# Patient Record
Sex: Male | Born: 1970 | Race: White | Hispanic: No | Marital: Married | Smoking: Never smoker
Health system: Southern US, Community
[De-identification: ages and names within clinical notes are randomized; demographics above are authoritative.]

## PROBLEM LIST (undated history)

## (undated) DIAGNOSIS — Z8249 Family history of ischemic heart disease and other diseases of the circulatory system: Secondary | ICD-10-CM

## (undated) DIAGNOSIS — B001 Herpesviral vesicular dermatitis: Secondary | ICD-10-CM

## (undated) DIAGNOSIS — E785 Hyperlipidemia, unspecified: Secondary | ICD-10-CM

## (undated) DIAGNOSIS — C4491 Basal cell carcinoma of skin, unspecified: Secondary | ICD-10-CM

## (undated) DIAGNOSIS — E0500 Thyrotoxicosis with diffuse goiter without thyrotoxic crisis or storm: Secondary | ICD-10-CM

## (undated) HISTORY — DX: Basal cell carcinoma of skin, unspecified: C44.91

## (undated) HISTORY — DX: Herpesviral vesicular dermatitis: B00.1

## (undated) HISTORY — DX: Thyrotoxicosis with diffuse goiter without thyrotoxic crisis or storm: E05.00

## (undated) HISTORY — DX: Family history of ischemic heart disease and other diseases of the circulatory system: Z82.49

## (undated) HISTORY — DX: Hyperlipidemia, unspecified: E78.5

---

## 1998-08-23 ENCOUNTER — Emergency Department (HOSPITAL_COMMUNITY): Admission: EM | Admit: 1998-08-23 | Discharge: 1998-08-23 | Payer: Self-pay | Admitting: Emergency Medicine

## 2004-12-23 ENCOUNTER — Encounter: Admission: RE | Admit: 2004-12-23 | Discharge: 2004-12-23 | Payer: Self-pay | Admitting: Gastroenterology

## 2005-02-03 ENCOUNTER — Ambulatory Visit: Payer: Self-pay | Admitting: Hematology & Oncology

## 2005-03-27 ENCOUNTER — Ambulatory Visit: Payer: Self-pay | Admitting: Hematology & Oncology

## 2006-09-14 ENCOUNTER — Ambulatory Visit: Payer: Self-pay | Admitting: Family Medicine

## 2006-12-08 ENCOUNTER — Ambulatory Visit: Payer: Self-pay | Admitting: Family Medicine

## 2007-01-10 ENCOUNTER — Ambulatory Visit: Payer: Self-pay | Admitting: Family Medicine

## 2008-07-02 ENCOUNTER — Ambulatory Visit: Payer: Self-pay | Admitting: Family Medicine

## 2009-08-27 ENCOUNTER — Ambulatory Visit: Payer: Self-pay | Admitting: Family Medicine

## 2010-06-10 ENCOUNTER — Ambulatory Visit: Payer: Self-pay | Admitting: Family Medicine

## 2010-12-17 HISTORY — PX: VASECTOMY: SHX75

## 2011-07-16 ENCOUNTER — Other Ambulatory Visit: Payer: Self-pay | Admitting: Family Medicine

## 2011-11-30 ENCOUNTER — Other Ambulatory Visit: Payer: Self-pay | Admitting: Family Medicine

## 2011-11-30 NOTE — Telephone Encounter (Signed)
Is this okay?

## 2012-01-07 ENCOUNTER — Encounter: Payer: Self-pay | Admitting: Internal Medicine

## 2012-01-13 ENCOUNTER — Encounter: Payer: Self-pay | Admitting: Family Medicine

## 2012-01-13 ENCOUNTER — Ambulatory Visit (INDEPENDENT_AMBULATORY_CARE_PROVIDER_SITE_OTHER): Payer: 59 | Admitting: Family Medicine

## 2012-01-13 VITALS — BP 120/80 | HR 64 | Ht 68.0 in | Wt 180.0 lb

## 2012-01-13 DIAGNOSIS — M67919 Unspecified disorder of synovium and tendon, unspecified shoulder: Secondary | ICD-10-CM

## 2012-01-13 DIAGNOSIS — Z0000 Encounter for general adult medical examination without abnormal findings: Secondary | ICD-10-CM

## 2012-01-13 DIAGNOSIS — C44310 Basal cell carcinoma of skin of unspecified parts of face: Secondary | ICD-10-CM

## 2012-01-13 DIAGNOSIS — J301 Allergic rhinitis due to pollen: Secondary | ICD-10-CM | POA: Insufficient documentation

## 2012-01-13 DIAGNOSIS — Z8639 Personal history of other endocrine, nutritional and metabolic disease: Secondary | ICD-10-CM

## 2012-01-13 DIAGNOSIS — C44319 Basal cell carcinoma of skin of other parts of face: Secondary | ICD-10-CM

## 2012-01-13 DIAGNOSIS — Z8249 Family history of ischemic heart disease and other diseases of the circulatory system: Secondary | ICD-10-CM | POA: Insufficient documentation

## 2012-01-13 DIAGNOSIS — M7582 Other shoulder lesions, left shoulder: Secondary | ICD-10-CM

## 2012-01-13 DIAGNOSIS — Z23 Encounter for immunization: Secondary | ICD-10-CM

## 2012-01-13 DIAGNOSIS — Z862 Personal history of diseases of the blood and blood-forming organs and certain disorders involving the immune mechanism: Secondary | ICD-10-CM

## 2012-01-13 DIAGNOSIS — Z8619 Personal history of other infectious and parasitic diseases: Secondary | ICD-10-CM

## 2012-01-13 DIAGNOSIS — J45990 Exercise induced bronchospasm: Secondary | ICD-10-CM | POA: Insufficient documentation

## 2012-01-13 LAB — COMPREHENSIVE METABOLIC PANEL
ALT: 109 U/L — ABNORMAL HIGH (ref 0–53)
AST: 273 U/L — ABNORMAL HIGH (ref 0–37)
Albumin: 4.5 g/dL (ref 3.5–5.2)
Alkaline Phosphatase: 79 U/L (ref 39–117)
BUN: 17 mg/dL (ref 6–23)
Creat: 0.97 mg/dL (ref 0.50–1.35)
Potassium: 4.1 mEq/L (ref 3.5–5.3)

## 2012-01-13 LAB — LIPID PANEL
HDL: 39 mg/dL — ABNORMAL LOW (ref >39)
LDL Cholesterol: 131 mg/dL — ABNORMAL HIGH (ref 0–99)
Total CHOL/HDL Ratio: 5 Ratio

## 2012-01-13 NOTE — Progress Notes (Signed)
Subjective:    Patient ID: Shawn Cook, male    DOB: 04/01/1971, 41 y.o.   MRN: [id number]  HPI He is here for complete examination. He recently saw his endocrinologist for evaluation of his underlying Graves' disease and apparently his blood work was normal. He continues to use Valtrex periodically for his herpes labialis. He also has underlying exercise-induced asthma but this is well controlled. He has a lesion present on his nose or the like me to evaluate. He also recently had a vasectomy. He does have 3 children. His marriage is going quite well. He does complain of a one year history of difficulty with left shoulder pain especially with maneuvers with abduction and external rotation. He has no history of injury to this. His been no popping locking or grinding.   Review of Systems Negative except as above    Objective:   Physical Exam BP 120/80  Pulse 64  Ht 5\' 8"  (1.727 m)  Wt 180 lb (81.647 kg)  BMI 27.37 kg/m2  General Appearance:    Alert, cooperative, no distress, appears stated age  Head:    Normocephalic, without obvious abnormality, atraumatic  Eyes:    PERRL, conjunctiva/corneas clear, EOM's intact, fundi    benign  Ears:    Normal TM's and external ear canals  Nose:   Nares normal, mucosa normal, no drainage or sinus   tenderness  Throat:   Lips, mucosa, and tongue normal; teeth and gums normal  Neck:   Supple, no lymphadenopathy;  thyroid:  no   enlargement/tenderness/nodules; no carotid   bruit or JVD  Back:    Spine nontender, no curvature, ROM normal, no CVA     tenderness  Lungs:     Clear to auscultation bilaterally without wheezes, rales or     ronchi; respirations unlabored  Chest Wall:    No tenderness or deformity   Heart:    Regular rate and rhythm, S1 and S2 normal, no murmur, rub   or gallop  Breast Exam:    No chest wall tenderness, masses or gynecomastia  Abdomen:     Soft, non-tender, nondistended, normoactive bowel sounds,    no masses, no  hepatosplenomegaly  Genitalia:   deferred   Rectal:    Normal sphincter tone, no masses or tenderness; guaiac negative stool.  Prostate smooth, no nodules, not enlarged.  Extremities:   No clubbing, cyanosis or edema.left shoulder exam shows good motion. Drop arm test was uncomfortable. No laxity noted. No palpable tenderness.   Pulses:   2+ and symmetric all extremities  Skin:   Skin color, texture, turgor normal, no rashes or lesions  Lymph nodes:   Cervical, supraclavicular, and axillary nodes normal  Neurologic:   CNII-XII intact, normal strength, sensation and gait; reflexes 2+ and symmetric throughout          Psych:   Normal mood, affect, hygiene and grooming.   EKG shows no acute changes.        Assessment & Plan:   1. Routine general medical examination at a health care facility  PR ELECTROCARDIOGRAM, COMPLETE, Tdap vaccine greater than or equal to 7yo IM, CBC with Differential, Comprehensive metabolic panel, Lipid panel, Hemoccult - 1 Card (office)  2. History of Graves' disease    3. Exercise-induced asthma    4. Allergic rhinitis due to pollen    5. History of herpes labialis    6. Family history of heart disease in male family member before age 48  PR ELECTROCARDIOGRAM,  COMPLETE  7. BCE (basal cell epithelioma), face  Ambulatory referral to Dermatology  8. Tendinitis of left rotator cuff     the left shoulder was prepped posteriorly with Betadine. 40 mg of Kenalog and 3 cc of Xylocaine was injected in the subacromial bursa without difficulty. He did get relief of his symptoms. I will renew his Ventolin as well as the Valtrex. TDaP given.

## 2012-01-13 NOTE — Patient Instructions (Signed)
Let me know if the shoulder continues to give you trouble.

## 2012-01-14 LAB — CBC WITH DIFFERENTIAL/PLATELET
Basophils Absolute: 0 10*3/uL (ref 0.0–0.1)
Basophils Relative: 0 % (ref 0–1)
HCT: 47.6 % (ref 39.0–52.0)
Lymphocytes Relative: 33 % (ref 12–46)
MCHC: 34.2 g/dL (ref 30.0–36.0)
Monocytes Absolute: 0.4 10*3/uL (ref 0.1–1.0)
Neutro Abs: 2.6 10*3/uL (ref 1.7–7.7)
Neutrophils Relative %: 55 % (ref 43–77)
Platelets: 242 10*3/uL (ref 150–400)
RDW: 12.2 % (ref 11.5–15.5)
WBC: 4.8 10*3/uL (ref 4.0–10.5)

## 2012-03-04 ENCOUNTER — Encounter: Payer: Self-pay | Admitting: Family Medicine

## 2012-03-04 ENCOUNTER — Ambulatory Visit (INDEPENDENT_AMBULATORY_CARE_PROVIDER_SITE_OTHER): Payer: 59 | Admitting: Family Medicine

## 2012-03-04 ENCOUNTER — Ambulatory Visit
Admission: RE | Admit: 2012-03-04 | Discharge: 2012-03-04 | Disposition: A | Discharge status: Home / Self Care | Payer: 59 | Source: Ambulatory Visit | Attending: Family Medicine | Admitting: Family Medicine

## 2012-03-04 VITALS — BP 140/90 | HR 76 | Wt 180.0 lb

## 2012-03-04 DIAGNOSIS — M25512 Pain in left shoulder: Secondary | ICD-10-CM

## 2012-03-04 DIAGNOSIS — R748 Abnormal levels of other serum enzymes: Secondary | ICD-10-CM

## 2012-03-04 DIAGNOSIS — M25519 Pain in unspecified shoulder: Secondary | ICD-10-CM

## 2012-03-04 DIAGNOSIS — R7401 Elevation of levels of liver transaminase levels: Secondary | ICD-10-CM

## 2012-03-04 LAB — COMPREHENSIVE METABOLIC PANEL
ALT: 39 U/L (ref 0–53)
Alkaline Phosphatase: 63 U/L (ref 39–117)
Potassium: 4.1 mEq/L (ref 3.5–5.3)
Sodium: 140 mEq/L (ref 135–145)
Total Bilirubin: 0.5 mg/dL (ref 0.3–1.2)
Total Protein: 7.1 g/dL (ref 6.0–8.3)

## 2012-03-04 NOTE — Progress Notes (Signed)
  Subjective:    Patient ID: Shawn Cook, male    DOB: 13-Dec-1970, 41 y.o.   MRN: [id number]  HPI He is here for recheck. Blood work did show elevated liver enzymes. He has had hepatitis A and B. immunizations. Several weeks prior to the last blood draw, he did have a viral illness. He also has a positive family history for heart disease. He continues to have left shoulder pain. He did state that the injection did help briefly control his symptoms.   Review of Systems     Objective:   Physical Exam Alert and in no distress. No scleral icterus noted. Left shoulder exam does show some minimal discomfort with extremes of rotation. Drop arm test negative. Supraspinatus testing normal. Hawkins and Neer's test was uncomfortable.      Assessment & Plan:   1. Elevated liver enzymes  Comprehensive metabolic panel, Hepatitis B surface antibody, Hepatitis A antibody, total, Hepatitis C antibody  2. Left shoulder pain  DG Shoulder Left  If  the x-ray is negative, I will refer for physical therapy. If continued difficulty, and MRI will then be ordered.

## 2012-03-07 ENCOUNTER — Telehealth: Payer: Self-pay

## 2012-03-07 MED ORDER — ATORVASTATIN CALCIUM 10 MG PO TABS: 10.0000 mg | ORAL_TABLET | Freq: Every day | ORAL | Status: DC

## 2012-03-07 NOTE — Telephone Encounter (Signed)
Pt would like to get his lab results It looks like you haven't seen them from the 19 th please advise

## 2012-03-07 NOTE — Progress Notes (Signed)
  Subjective:    Patient ID: Shawn Cook, male    DOB: [birth year], 41 y.o.   MRN: [id number]  HPI    Review of Systems     Objective:   Physical Exam        Assessment & Plan:  His blood work came back normal. I relayed this information to him leaving a message. I will start him on Lipitor and recommend rechecking this in 2 months.

## 2012-03-07 NOTE — Progress Notes (Signed)
Addended by: Ronnald Nian on: 03/07/2012 01:06 PM   Modules accepted: Orders

## 2012-03-08 ENCOUNTER — Telehealth: Payer: Self-pay

## 2012-03-08 NOTE — Telephone Encounter (Signed)
I left him a message on his cell phone. He is to start taking Lipitor. If he wants copies of his labs go ahead and given to them. Make sure he is scheduled for recheck in 2 months

## 2012-03-08 NOTE — Telephone Encounter (Signed)
Sent copy of labs

## 2012-03-08 NOTE — Telephone Encounter (Signed)
Pt would like to get results from labs on the 19 th Have you called him please advise because there is no notes so I dont think you have seen them

## 2012-03-17 ENCOUNTER — Encounter: Payer: Self-pay | Admitting: Dermatology

## 2012-06-13 ENCOUNTER — Ambulatory Visit (INDEPENDENT_AMBULATORY_CARE_PROVIDER_SITE_OTHER): Payer: 59 | Admitting: Family Medicine

## 2012-06-13 ENCOUNTER — Ambulatory Visit: Payer: 59 | Admitting: Family Medicine

## 2012-06-13 ENCOUNTER — Ambulatory Visit
Admission: RE | Admit: 2012-06-13 | Discharge: 2012-06-13 | Disposition: A | Discharge status: Home / Self Care | Payer: 59 | Source: Ambulatory Visit | Attending: Family Medicine | Admitting: Family Medicine

## 2012-06-13 ENCOUNTER — Encounter: Payer: Self-pay | Admitting: Family Medicine

## 2012-06-13 VITALS — BP 116/82 | HR 87 | Wt 180.0 lb

## 2012-06-13 DIAGNOSIS — M25519 Pain in unspecified shoulder: Secondary | ICD-10-CM

## 2012-06-13 NOTE — Progress Notes (Signed)
  Subjective:    Patient ID: Shawn Cook, male    DOB: 1971/06/02, 41 y.o.   MRN: [id number]  HPI He was seen in April for evaluation of left shoulder pain. An x-ray at that time was negative. An injection was given which did very little to help with his symptoms. He was then referred to physical therapy and has been diligent with going to therapy but still continues to have left shoulder pain. Mainly in the posterior shoulder area. He also notes pain at night .He has pain with abduction internal and external rotation.   Review of Systems     Objective:   Physical Exam Full range of motion of the shoulder. No tenderness to palpation. Abduction and external rotation does cause discomfort. Negative drop arm test. Supraspinatus testing normal. No joint laxity noted.       Assessment & Plan:  Left shoulder pain, etiology unclear.MRI The MRI report came back later in the day. Did show some a.c. joint pathology. He will be referred to orthopedics for further evaluation.

## 2012-11-24 ENCOUNTER — Other Ambulatory Visit: Payer: Self-pay | Admitting: Family Medicine

## 2012-12-20 ENCOUNTER — Telehealth: Payer: Self-pay | Admitting: Internal Medicine

## 2012-12-20 MED ORDER — ALBUTEROL SULFATE HFA 108 (90 BASE) MCG/ACT IN AERS: 2.0000 | INHALATION_SPRAY | Freq: Four times a day (QID) | RESPIRATORY_TRACT | Status: DC | PRN

## 2012-12-20 MED ORDER — VALACYCLOVIR HCL 1 G PO TABS: 1000.0000 mg | ORAL_TABLET | Freq: Every day | ORAL | Status: DC

## 2012-12-20 NOTE — Telephone Encounter (Signed)
Pt is coming in for a physical march 10 but needs a refill on valtrex 1000mg  for when he has a cold sore outbreak and then needs a refill on ventolin inhaler sent to [facility] on [address]

## 2013-01-23 ENCOUNTER — Telehealth: Payer: Self-pay | Admitting: Internal Medicine

## 2013-01-23 ENCOUNTER — Encounter: Payer: Self-pay | Admitting: Family Medicine

## 2013-01-23 ENCOUNTER — Ambulatory Visit (INDEPENDENT_AMBULATORY_CARE_PROVIDER_SITE_OTHER): Payer: 59 | Admitting: Family Medicine

## 2013-01-23 VITALS — BP 120/78 | HR 80 | Ht 68.0 in | Wt 156.0 lb

## 2013-01-23 DIAGNOSIS — Z862 Personal history of diseases of the blood and blood-forming organs and certain disorders involving the immune mechanism: Secondary | ICD-10-CM

## 2013-01-23 DIAGNOSIS — Z8249 Family history of ischemic heart disease and other diseases of the circulatory system: Secondary | ICD-10-CM

## 2013-01-23 DIAGNOSIS — Z8619 Personal history of other infectious and parasitic diseases: Secondary | ICD-10-CM

## 2013-01-23 DIAGNOSIS — Z79899 Other long term (current) drug therapy: Secondary | ICD-10-CM

## 2013-01-23 DIAGNOSIS — Z8639 Personal history of other endocrine, nutritional and metabolic disease: Secondary | ICD-10-CM

## 2013-01-23 DIAGNOSIS — J301 Allergic rhinitis due to pollen: Secondary | ICD-10-CM

## 2013-01-23 DIAGNOSIS — Z0000 Encounter for general adult medical examination without abnormal findings: Secondary | ICD-10-CM

## 2013-01-23 DIAGNOSIS — J45990 Exercise induced bronchospasm: Secondary | ICD-10-CM

## 2013-01-23 LAB — HEMOCCULT GUIAC POC 1CARD (OFFICE)

## 2013-01-23 LAB — POCT URINALYSIS DIPSTICK
Bilirubin, UA: NEGATIVE
Glucose, UA: NEGATIVE
Ketones, UA: NEGATIVE
Spec Grav, UA: 1.01

## 2013-01-23 LAB — CBC WITH DIFFERENTIAL/PLATELET
Basophils Relative: 1 % (ref 0–1)
Eosinophils Absolute: 0.2 10*3/uL (ref 0.0–0.7)
Hemoglobin: 14.6 g/dL (ref 13.0–17.0)
MCH: 29.8 pg (ref 26.0–34.0)
MCHC: 33.9 g/dL (ref 30.0–36.0)
Monocytes Relative: 9 % (ref 3–12)
Neutrophils Relative %: 48 % (ref 43–77)
Platelets: 160 10*3/uL (ref 150–400)

## 2013-01-23 NOTE — Telephone Encounter (Signed)
Pt has an appt with Dr. [place] on Friday April 4 @ 3:30pm. Address is [address]. Phone # [phone]

## 2013-01-23 NOTE — Progress Notes (Signed)
Subjective:    Patient ID: Shawn Cook, male    DOB: Apr 15, 1971, 42 y.o.   MRN: [id number]  HPI He is here for complete examination. He has no particular concerns or complaints. He continues on medications listed in the chart. He does have a previous history of Graves' disease and presently is taking thyroid. He does have allergies as well as exercise-induced asthma and has this under good control. He does have herpes labialis and again not having trouble with that. A significant past family history indicates his mother had a heart attack at age 75 and died at age 77. His work and home life are going quite well. He now has 3 children. His marriage is going well. He has had a vasectomy.  Review of Systems  Constitutional: Negative.   HENT: Negative.   Eyes: Negative.   Respiratory: Negative.   Cardiovascular: Negative.   Gastrointestinal: Negative.   Endocrine: Negative.   Genitourinary: Negative.   Musculoskeletal: Negative.   Allergic/Immunologic: Negative.   Neurological: Negative.   Hematological: Negative.   Psychiatric/Behavioral: Negative.        Objective:   Physical Exam BP 120/78  Pulse 80  Ht 5\' 8"  (1.727 m)  Wt 156 lb (70.761 kg)  BMI 23.73 kg/m2  General Appearance:    Alert, cooperative, no distress, appears stated age  Head:    Normocephalic, without obvious abnormality, atraumatic  Eyes:    PERRL, conjunctiva/corneas clear, EOM's intact, fundi    benign  Ears:    Normal TM's and external ear canals  Nose:   Nares normal, mucosa normal, no drainage or sinus   tenderness  Throat:   Lips, mucosa, and tongue normal; teeth and gums normal  Neck:   Supple, no lymphadenopathy;  thyroid:  no   enlargement/tenderness/nodules; no carotid   bruit or JVD  Back:    Spine nontender, no curvature, ROM normal, no CVA     tenderness  Lungs:     Clear to auscultation bilaterally without wheezes, rales or     ronchi; respirations unlabored  Chest Wall:    No  tenderness or deformity   Heart:    Regular rate and rhythm, S1 and S2 normal, no murmur, rub   or gallop  Breast Exam:    No chest wall tenderness, masses or gynecomastia  Abdomen:     Soft, non-tender, nondistended, normoactive bowel sounds,    no masses, no hepatosplenomegaly  Genitalia:    Normal male external genitalia without lesions.  Testicles without masses.  No inguinal hernias.  Rectal:    Normal sphincter tone, no masses or tenderness; guaiac negative stool.  Prostate smooth, no nodules, not enlarged.  Extremities:   No clubbing, cyanosis or edema  Pulses:   2+ and symmetric all extremities  Skin:   Skin color, texture, turgor normal, no rashes or lesions  Lymph nodes:   Cervical, supraclavicular, and axillary nodes normal  Neurologic:   CNII-XII intact, normal strength, sensation and gait; reflexes 2+ and symmetric throughout          Psych:   Normal mood, affect, hygiene and grooming.   EKG shows no acute changes       Assessment & Plan:  Routine general medical examination at a health care facility - Plan: Visual acuity screening, POCT Urinalysis Dipstick, Lipid panel, CBC with Differential, Comprehensive metabolic panel, Hemoccult - 1 Card (office)  History of Graves' disease - Plan: TSH  Exercise-induced asthma  Allergic rhinitis due to pollen  History of herpes labialis  Encounter for long-term (current) use of other medications  Family history of heart disease in male family member before age 87 - Plan: Lipid panel, EKG 12-Lead, Ambulatory referral to Cardiology he is to continue on his present medication regimen. Discussed cardiology referral and since he is now 77, is probably appropriate to get a cardiology consult.

## 2013-01-24 LAB — COMPREHENSIVE METABOLIC PANEL
Alkaline Phosphatase: 67 U/L (ref 39–117)
Glucose, Bld: 94 mg/dL (ref 70–99)
Sodium: 139 mEq/L (ref 135–145)
Total Bilirubin: 0.7 mg/dL (ref 0.3–1.2)
Total Protein: 7.1 g/dL (ref 6.0–8.3)

## 2013-01-24 LAB — LIPID PANEL
Cholesterol: 216 mg/dL — ABNORMAL HIGH (ref 0–200)
HDL: 47 mg/dL (ref >39)
Total CHOL/HDL Ratio: 4.6 Ratio
Triglycerides: 87 mg/dL (ref <150)
VLDL: 17 mg/dL (ref 0–40)

## 2013-01-24 NOTE — Progress Notes (Signed)
Quick Note:  Called pt cell pt aware will pick up card and will let me know where he wants them sent ______

## 2013-02-17 ENCOUNTER — Encounter: Payer: Self-pay | Admitting: Cardiology

## 2013-02-17 ENCOUNTER — Ambulatory Visit (INDEPENDENT_AMBULATORY_CARE_PROVIDER_SITE_OTHER): Payer: 59 | Admitting: Cardiology

## 2013-02-17 VITALS — BP 124/80 | HR 69 | Ht 68.0 in | Wt 166.4 lb

## 2013-02-17 DIAGNOSIS — E7800 Pure hypercholesterolemia, unspecified: Secondary | ICD-10-CM

## 2013-02-17 DIAGNOSIS — Z8249 Family history of ischemic heart disease and other diseases of the circulatory system: Secondary | ICD-10-CM

## 2013-02-17 NOTE — Patient Instructions (Signed)
I agree with starting lipitor and ASA 81 mg daily.  If you develop chest pain or shortness of breath let me know.

## 2013-02-17 NOTE — Progress Notes (Signed)
Shawn Cook Date of Birth: 05-Jun-1971 Medical Record #[id number]  History of Present Illness: Shawn Cook is seen at the request of Dr. Susann Givens for evaluation of cardiovascular risk. 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 He does have a history of Graves' disease and hypercholesterolemia. He is concerned about his family history. His mother had a myocardial infarction at age 29. She subsequently had bypass surgery and ended up dying at age 25. She was a heavy smoker. His father died at age 24 of suicide. There apparently is a strong history of heart disease on his paternal side of the family. He has one brother in good health. He denies any symptoms of chest pain, shortness of breath, or palpitations. He is physically active. He has no history of hypertension, diabetes, or tobacco use.  Current Outpatient Prescriptions on File Prior to Visit  Medication Sig Dispense Refill  . albuterol (VENTOLIN HFA) 108 (90 BASE) MCG/ACT inhaler Inhale 2 puffs into the lungs every 6 (six) hours as needed for wheezing.  18 each  0  . atorvastatin (LIPITOR) 10 MG tablet Take 1 tablet (10 mg total) by mouth daily.  90 tablet  3  . levothyroxine (SYNTHROID, LEVOTHROID) 175 MCG tablet Take 175 mcg by mouth daily.      . valACYclovir (VALTREX) 1000 MG tablet Take 1 tablet (1,000 mg total) by mouth daily.  30 tablet  11   No current facility-administered medications on file prior to visit.    No Known Allergies  Past Medical History  Diagnosis Date  . Herpes labialis   . Asthma   . Allergic rhinitis   . FHx: cardiovascular disease   . Grave's disease   . Dyslipidemia   . BCE (basal cell epithelioma)     nose    Past Surgical History  Procedure Laterality Date  . Rotator cuff surgery Left 06/2013  . Vasectomy  12/2010    History  Smoking status  . Never Smoker   Smokeless tobacco  . Never Used    History  Alcohol Use  . 1.2 oz/week  . 2 Cans of beer per  week    Family History  Problem Relation Age of Onset  . Early death Mother   . Heart disease Mother 42    MI    Review of Systems: As noted in history of present illness.  All other systems were reviewed and are negative.  Physical Exam: BP 124/80  Pulse 69  Ht 5\' 8"  (1.727 m)  Wt 166 lb 6.4 oz (75.479 kg)  BMI 25.31 kg/m2  SpO2 98% He is a thin white male in no acute distress. The patient is alert and oriented x 3.  The mood and affect are normal.  The skin is warm and dry.  Color is normal.  The HEENT exam reveals that the sclera are nonicteric.  The mucous membranes are moist.  The carotids are 2+ without bruits.  There is no thyromegaly.  There is no JVD.  The lungs are clear.  The chest wall is non tender.  The heart exam reveals a regular rate with a normal S1 and S2.  There are no murmurs, gallops, or rubs.  The PMI is not displaced.   Abdominal exam reveals good bowel sounds.  There is no guarding or rebound.  There is no hepatosplenomegaly or tenderness.  There are no masses.  Exam of the legs reveal no clubbing, cyanosis, or edema.  The legs are  without rashes.  The distal pulses are intact.  Cranial nerves II - XII are intact.  Motor and sensory functions are intact.  The gait is normal.  LABORATORY DATA: ECG is normal. Lab Results  Component Value Date   WBC 3.6* 01/23/2013   HGB 14.6 01/23/2013   HCT 43.1 01/23/2013   PLT 160 01/23/2013   GLUCOSE 94 01/23/2013   CHOL 216* 01/23/2013   TRIG 87 01/23/2013   HDL 47 01/23/2013   LDLCALC 152* 01/23/2013   ALT 25 01/23/2013   AST 26 01/23/2013   NA 139 01/23/2013   K 4.4 01/23/2013   CL 102 01/23/2013   CREATININE 1.12 01/23/2013   BUN 17 01/23/2013   CO2 31 01/23/2013   TSH 1.347 01/23/2013     Assessment / Plan:  1. Family history of premature coronary disease. Patient is asymptomatic. His cardiac exam and ECG are normal. I do not feel that he needs any further testing at this point but I have recommended risk factor  modification. He has been prescribed Lipitor and I recommended that he take it with a goal LDL of less than 100. I recommended he take a baby aspirin daily. If he should develop any cardiac symptoms in the future such as chest pain or dyspnea then I would recommend stress testing.  2. Hypercholesterolemia

## 2013-05-15 ENCOUNTER — Ambulatory Visit (INDEPENDENT_AMBULATORY_CARE_PROVIDER_SITE_OTHER): Payer: 59 | Admitting: Family Medicine

## 2013-05-15 ENCOUNTER — Encounter: Payer: Self-pay | Admitting: Family Medicine

## 2013-05-15 VITALS — BP 130/90 | HR 74 | Temp 98.3°F | Wt 154.0 lb

## 2013-05-15 DIAGNOSIS — Z209 Contact with and (suspected) exposure to unspecified communicable disease: Secondary | ICD-10-CM

## 2013-05-15 DIAGNOSIS — E7800 Pure hypercholesterolemia, unspecified: Secondary | ICD-10-CM

## 2013-05-15 DIAGNOSIS — J358 Other chronic diseases of tonsils and adenoids: Secondary | ICD-10-CM

## 2013-05-15 MED ORDER — PRAVASTATIN SODIUM 20 MG PO TABS: 20.0000 mg | ORAL_TABLET | Freq: Every day | ORAL | Status: DC

## 2013-05-15 NOTE — Progress Notes (Signed)
  Subjective:    Patient ID: Shawn Cook, male    DOB: 1971-09-08, 42 y.o.   MRN: [id number]  HPI Complains of intermittent difficulty with throat discomfort especially on the left. He has been able to manually remove whitish material from his tonsils are still has concerns over them. Also he has not started on his statin due to cost of the medication. He also has concerns over possible STD exposure from an experienced 3 weeks ago. He is having no symptoms from this.   Review of Systems     Objective:   Physical Exam Alert and in no distress. Exam of the throat does show slight tonsillar erythema left greater than right with evidence of crypts. Neck is supple without adenopathy. Heart and lung exam normal.      Assessment & Plan:  Hypercholesterolemia - Plan: pravastatin (PRAVACHOL) 20 MG tablet  Contact with or exposure to unspecified communicable disease - Plan: GC/chlamydia probe amp, urine  Tonsillith  I will switch him to Pravachol which did help with the cost. Discussed the tonsillith with him and at this point no intervention is needed. GC/Chlamydia culture taken.

## 2013-06-16 HISTORY — PX: OTHER SURGICAL HISTORY: SHX169

## 2013-07-10 ENCOUNTER — Other Ambulatory Visit: Payer: Self-pay | Admitting: *Deleted

## 2013-07-10 DIAGNOSIS — E7800 Pure hypercholesterolemia, unspecified: Secondary | ICD-10-CM

## 2013-07-10 DIAGNOSIS — E890 Postprocedural hypothyroidism: Secondary | ICD-10-CM | POA: Insufficient documentation

## 2013-07-10 DIAGNOSIS — E039 Hypothyroidism, unspecified: Secondary | ICD-10-CM

## 2013-07-18 ENCOUNTER — Other Ambulatory Visit (INDEPENDENT_AMBULATORY_CARE_PROVIDER_SITE_OTHER): Payer: 59

## 2013-07-18 DIAGNOSIS — E039 Hypothyroidism, unspecified: Secondary | ICD-10-CM

## 2013-07-18 DIAGNOSIS — E7800 Pure hypercholesterolemia, unspecified: Secondary | ICD-10-CM

## 2013-07-18 LAB — LIPID PANEL
Total CHOL/HDL Ratio: 4
VLDL: 21.2 mg/dL (ref 0.0–40.0)

## 2013-07-18 LAB — BASIC METABOLIC PANEL WITH GFR
BUN: 25 mg/dL — ABNORMAL HIGH (ref 6–23)
CO2: 30 meq/L (ref 19–32)
Calcium: 8.9 mg/dL (ref 8.4–10.5)
Chloride: 98 meq/L (ref 96–112)
Creatinine, Ser: 1.3 mg/dL (ref 0.4–1.5)
GFR: 65.72 mL/min
Glucose, Bld: 85 mg/dL (ref 70–99)
Potassium: 3.6 meq/L (ref 3.5–5.1)
Sodium: 135 meq/L (ref 135–145)

## 2013-07-18 LAB — T4, FREE: Free T4: 1.26 ng/dL (ref 0.60–1.60)

## 2013-07-18 LAB — LDL CHOLESTEROL, DIRECT: Direct LDL: 151.6 mg/dL

## 2013-07-18 LAB — TSH: TSH: 1.12 u[IU]/mL (ref 0.35–5.50)

## 2013-07-25 ENCOUNTER — Ambulatory Visit (INDEPENDENT_AMBULATORY_CARE_PROVIDER_SITE_OTHER): Payer: 59 | Admitting: Endocrinology

## 2013-07-25 ENCOUNTER — Encounter: Payer: Self-pay | Admitting: Endocrinology

## 2013-07-25 VITALS — BP 112/80 | HR 59 | Temp 98.4°F | Resp 12 | Ht 68.0 in | Wt 158.0 lb

## 2013-07-25 DIAGNOSIS — E890 Postprocedural hypothyroidism: Secondary | ICD-10-CM

## 2013-07-25 NOTE — Progress Notes (Signed)
Patient ID: Shawn Cook, male   DOB: 1971/10/02, 42 y.o.   MRN: [id number]  Reason for Appointment:  Hypothyroidism, followup visit    History of Present Illness:   The hypothyroidism was first diagnosed in 2006 Complaints are reported by the patient now are none and he has been feeling fairly good without any unusual fatigue or cold sensitivity He has lost weight with exercising regularly      .           The treatments that the patient has taken include Synthroid 150, the same dose for about 2 years           Compliance with the medical regimen has been as prescribed with taking the tablet in the morning before breakfast.  No visits with results within 1 Week(s) from this visit. Latest known visit with results is:  Appointment on 07/18/2013  Component Date Value Range Status  . Cholesterol 07/18/2013 212* 0 - 200 mg/dL Final   ATP III Classification       Desirable:  < 200 mg/dL               Borderline High:  200 - 239 mg/dL          High:  > = 409 mg/dL  . Triglycerides 07/18/2013 106.0  0.0 - 149.0 mg/dL Final   Normal:  <811 mg/dLBorderline High:  150 - 199 mg/dL  . HDL 07/18/2013 52.70  >39.00 mg/dL Final  . VLDL 91/47/8295 21.2  0.0 - 40.0 mg/dL Final  . Total CHOL/HDL Ratio 07/18/2013 4   Final                  Men          Women1/2 Average Risk     3.4          3.3Average Risk          5.0          4.42X Average Risk          9.6          7.13X Average Risk          15.0          11.0                      . Free T4 07/18/2013 1.26  0.60 - 1.60 ng/dL Final  . TSH 62/13/0865 1.12  0.35 - 5.50 uIU/mL Final  . Sodium 07/18/2013 135  135 - 145 mEq/L Final  . Potassium 07/18/2013 3.6  3.5 - 5.1 mEq/L Final  . Chloride 07/18/2013 98  96 - 112 mEq/L Final  . CO2 07/18/2013 30  19 - 32 mEq/L Final  . Glucose, Bld 07/18/2013 85  70 - 99 mg/dL Final  . BUN 78/46/9629 25* 6 - 23 mg/dL Final  . Creatinine, Ser 07/18/2013 1.3  0.4 - 1.5 mg/dL Final  . Calcium 52/84/1324 8.9   8.4 - 10.5 mg/dL Final  . GFR 40/08/2724 65.72  >60.00 mL/min Final  . Direct LDL 07/18/2013 151.6   Final   Optimal:  <100 mg/dLNear or Above Optimal:  100-129 mg/dLBorderline High:  130-159 mg/dLHigh:  160-189 mg/dLVery High:  >190 mg/dL      Medication List       This list is accurate as of: 07/25/13  9:45 AM.  Always use your most recent med list.  albuterol 108 (90 BASE) MCG/ACT inhaler  Commonly known as:  VENTOLIN HFA  Inhale 2 puffs into the lungs every 6 (six) hours as needed for wheezing.     aspirin 81 MG tablet  Take 81 mg by mouth daily.     fish oil-omega-3 fatty acids 1000 MG capsule  Take 2 g by mouth daily.     levothyroxine 175 MCG tablet  Commonly known as:  SYNTHROID, LEVOTHROID  Take 175 mcg by mouth daily.     multivitamin with minerals Tabs tablet  Take 1 tablet by mouth daily.     pravastatin 20 MG tablet  Commonly known as:  PRAVACHOL  Take 1 tablet (20 mg total) by mouth daily.     valACYclovir 1000 MG tablet  Commonly known as:  VALTREX  Take 1 tablet (1,000 mg total) by mouth daily.        Past Medical History  Diagnosis Date  . Herpes labialis   . Asthma   . Allergic rhinitis   . FHx: cardiovascular disease   . Grave's disease   . Dyslipidemia   . BCE (basal cell epithelioma)     nose    Past Surgical History  Procedure Laterality Date  . Rotator cuff surgery Left 06/2013  . Vasectomy  12/2010    Family History  Problem Relation Age of Onset  . Early death Mother   . Heart disease Mother 75    MI    Social History:  reports that he has never smoked. He has never used smokeless tobacco. He reports that he drinks about 1.2 ounces of alcohol per week. He reports that he does not use illicit drugs.  Allergies:  Allergies  Allergen Reactions  . Shellfish Allergy      Examination:   BP 112/80  Pulse 59  Temp(Src) 98.4 F (36.9 C)  Resp 12  Ht 5\' 8"  (1.727 m)  Wt 158 lb (71.668 kg)  BMI 24.03 kg/m2   SpO2 97%   GENERAL APPEARANCE: Alert And looks well.no puffiness of eyes               NEUROLOGIC EXAM: DTRs 2+ bilaterally at biceps. 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    Treatment:   Continue same dosage before breakfast daily. Avoid taking any calcium or iron supplements with the thyroid supplement.   He will followup again in 18 months  Gwenda Heiner 07/25/2013, 9:45 AM

## 2013-09-14 ENCOUNTER — Other Ambulatory Visit: Payer: Self-pay | Admitting: *Deleted

## 2013-09-14 MED ORDER — LEVOTHYROXINE SODIUM 175 MCG PO TABS: 175.0000 ug | ORAL_TABLET | Freq: Every day | ORAL | Status: DC

## 2013-09-21 ENCOUNTER — Other Ambulatory Visit: Payer: Self-pay

## 2013-11-29 ENCOUNTER — Ambulatory Visit
Admission: RE | Admit: 2013-11-29 | Discharge: 2013-11-29 | Disposition: A | Discharge status: Home / Self Care | Payer: 59 | Source: Ambulatory Visit | Attending: Medical | Admitting: Medical

## 2013-11-29 ENCOUNTER — Encounter: Payer: Self-pay | Admitting: Medical

## 2013-11-29 ENCOUNTER — Other Ambulatory Visit: Payer: Self-pay | Admitting: Medical

## 2013-11-29 ENCOUNTER — Ambulatory Visit (INDEPENDENT_AMBULATORY_CARE_PROVIDER_SITE_OTHER): Payer: 59 | Admitting: Medical

## 2013-11-29 VITALS — BP 130/78 | HR 79 | Temp 97.9°F | Resp 16 | Wt 162.0 lb

## 2013-11-29 DIAGNOSIS — N5089 Other specified disorders of the male genital organs: Secondary | ICD-10-CM

## 2013-11-29 DIAGNOSIS — IMO0002 Reserved for concepts with insufficient information to code with codable children: Secondary | ICD-10-CM

## 2013-11-29 DIAGNOSIS — N509 Disorder of male genital organs, unspecified: Secondary | ICD-10-CM

## 2013-11-29 LAB — POCT URINALYSIS DIPSTICK
BILIRUBIN UA: NEGATIVE
Blood, UA: NEGATIVE
Glucose, UA: NEGATIVE
KETONES UA: NEGATIVE
LEUKOCYTES UA: NEGATIVE
Nitrite, UA: NEGATIVE
PH UA: 7
Protein, UA: NEGATIVE
Urobilinogen, UA: NEGATIVE

## 2013-11-29 MED ORDER — CIPROFLOXACIN HCL 500 MG PO TABS: 500.0000 mg | ORAL_TABLET | Freq: Two times a day (BID) | ORAL | Status: DC

## 2013-11-29 NOTE — Progress Notes (Signed)
Subjective: Here for discomfort in right testicle.  Normally sees Dr. Redmond School here for years.  He notes right testicle aching for 1 week.   Had similar back in September as well.  Gets a dull achy feeling from time to time, intermittent.  Denies problem with urination or ejaculation.  Right testicle seems increased in size over the last 1-2 month.  Denies any lumps or bumps.  Denies burning, no cloudy urine, no redness, no rash, no penile discharge, no swelling, no urinary hesitancy, decreased stream, no dribbling, no blood in semen.   At times urine is odorous.  Married, no concern for STD.  No hx/o STD, no hx/o prostate infection.  No hematuria, no pus in urine.  Past Medical History  Diagnosis Date  . Herpes labialis   . Asthma   . Allergic rhinitis   . FHx: cardiovascular disease   . Grave's disease   . Dyslipidemia   . BCE (basal cell epithelioma)     nose   ROS as in subjective  Objective: General: Well-developed, WN, NAD Abdomen: ontender, no mass, no organomegaly GU: Normal male genitalia, circumcised, no rash, slight tenderness of the right testicle which seems mildly asymmetrical enlarged compared to the left, no obvious lump or mass otherwise, no lymphadenopathy, no hernia no erythema of the urethra Back: Nontender Extremities no swelling  Assessment: Encounter Diagnoses  Name Primary?  . Testicular/scrotal pain Yes  . Testicle swelling    Plan: Discussed possible causes including orchitis, prostatitis, mass, infection, other.  Urinalysis today unremarkable.  Begin Cipro x 10-14 days, scrotal elevation, briefs instead of boxers for now, ibuprofen 2-3 times a day over-the-counter.  At his request will move forward with scrotal ultrasound, rule out GC and Chlamydia.  Followup pending studies.

## 2013-11-30 LAB — GC/CHLAMYDIA PROBE AMP
CT Probe RNA: NEGATIVE
GC Probe RNA: NEGATIVE

## 2013-12-01 ENCOUNTER — Other Ambulatory Visit: Payer: 59

## 2014-03-21 ENCOUNTER — Other Ambulatory Visit: Payer: Self-pay | Admitting: Family Medicine

## 2014-04-14 ENCOUNTER — Other Ambulatory Visit: Payer: Self-pay | Admitting: Endocrinology

## 2014-08-01 ENCOUNTER — Other Ambulatory Visit: Payer: Self-pay | Admitting: Family Medicine

## 2014-09-13 ENCOUNTER — Other Ambulatory Visit: Payer: Self-pay | Admitting: Family Medicine

## 2014-12-12 ENCOUNTER — Ambulatory Visit (INDEPENDENT_AMBULATORY_CARE_PROVIDER_SITE_OTHER): Payer: 59 | Admitting: Family Medicine

## 2014-12-12 ENCOUNTER — Encounter: Payer: Self-pay | Admitting: Family Medicine

## 2014-12-12 VITALS — BP 126/80 | HR 78 | Ht 68.0 in | Wt 170.0 lb

## 2014-12-12 DIAGNOSIS — J301 Allergic rhinitis due to pollen: Secondary | ICD-10-CM

## 2014-12-12 DIAGNOSIS — Z0000 Encounter for general adult medical examination without abnormal findings: Secondary | ICD-10-CM

## 2014-12-12 DIAGNOSIS — E7800 Pure hypercholesterolemia, unspecified: Secondary | ICD-10-CM

## 2014-12-12 DIAGNOSIS — Z8249 Family history of ischemic heart disease and other diseases of the circulatory system: Secondary | ICD-10-CM

## 2014-12-12 DIAGNOSIS — Z8619 Personal history of other infectious and parasitic diseases: Secondary | ICD-10-CM

## 2014-12-12 DIAGNOSIS — Z23 Encounter for immunization: Secondary | ICD-10-CM

## 2014-12-12 DIAGNOSIS — J0100 Acute maxillary sinusitis, unspecified: Secondary | ICD-10-CM

## 2014-12-12 DIAGNOSIS — J45990 Exercise induced bronchospasm: Secondary | ICD-10-CM

## 2014-12-12 DIAGNOSIS — Z8639 Personal history of other endocrine, nutritional and metabolic disease: Secondary | ICD-10-CM

## 2014-12-12 DIAGNOSIS — J019 Acute sinusitis, unspecified: Secondary | ICD-10-CM | POA: Insufficient documentation

## 2014-12-12 LAB — CBC WITH DIFFERENTIAL/PLATELET
BASOS ABS: 0 10*3/uL (ref 0.0–0.1)
BASOS PCT: 0 % (ref 0–1)
Eosinophils Absolute: 0.1 10*3/uL (ref 0.0–0.7)
Eosinophils Relative: 1 % (ref 0–5)
HEMATOCRIT: 43.3 % (ref 39.0–52.0)
HEMOGLOBIN: 14.5 g/dL (ref 13.0–17.0)
LYMPHS PCT: 18 % (ref 12–46)
Lymphs Abs: 1.5 10*3/uL (ref 0.7–4.0)
MCH: 31 pg (ref 26.0–34.0)
MCHC: 33.5 g/dL (ref 30.0–36.0)
MCV: 92.7 fL (ref 78.0–100.0)
MONO ABS: 0.6 10*3/uL (ref 0.1–1.0)
MPV: 10.4 fL (ref 8.6–12.4)
Monocytes Relative: 8 % (ref 3–12)
Neutro Abs: 5.9 10*3/uL (ref 1.7–7.7)
Neutrophils Relative %: 73 % (ref 43–77)
PLATELETS: 218 10*3/uL (ref 150–400)
RBC: 4.67 MIL/uL (ref 4.22–5.81)
RDW: 13.6 % (ref 11.5–15.5)
WBC: 8.1 10*3/uL (ref 4.0–10.5)

## 2014-12-12 LAB — COMPREHENSIVE METABOLIC PANEL
ALT: 37 U/L (ref 0–53)
AST: 28 U/L (ref 0–37)
Albumin: 4.1 g/dL (ref 3.5–5.2)
Alkaline Phosphatase: 92 U/L (ref 39–117)
BUN: 15 mg/dL (ref 6–23)
CO2: 27 mEq/L (ref 19–32)
CREATININE: 1.01 mg/dL (ref 0.50–1.35)
Calcium: 9.7 mg/dL (ref 8.4–10.5)
Chloride: 99 mEq/L (ref 96–112)
Glucose, Bld: 91 mg/dL (ref 70–99)
Potassium: 3.9 mEq/L (ref 3.5–5.3)
SODIUM: 138 meq/L (ref 135–145)
Total Bilirubin: 0.6 mg/dL (ref 0.2–1.2)
Total Protein: 7.1 g/dL (ref 6.0–8.3)

## 2014-12-12 MED ORDER — VALACYCLOVIR HCL 1 G PO TABS: 1000.0000 mg | ORAL_TABLET | Freq: Every day | ORAL | Status: DC

## 2014-12-12 MED ORDER — PRAVASTATIN SODIUM 20 MG PO TABS: 20.0000 mg | ORAL_TABLET | Freq: Every day | ORAL | Status: DC

## 2014-12-12 MED ORDER — ALBUTEROL SULFATE HFA 108 (90 BASE) MCG/ACT IN AERS: 2.0000 | INHALATION_SPRAY | Freq: Four times a day (QID) | RESPIRATORY_TRACT | Status: DC | PRN

## 2014-12-12 MED ORDER — AMOXICILLIN 875 MG PO TABS: 875.0000 mg | ORAL_TABLET | Freq: Two times a day (BID) | ORAL | Status: DC

## 2014-12-12 MED ORDER — LEVOTHYROXINE SODIUM 175 MCG PO TABS: ORAL_TABLET | ORAL | Status: DC

## 2014-12-12 NOTE — Progress Notes (Signed)
Subjective:    Patient ID: Shawn Cook, male    DOB: 20-Jan-1971, 44 y.o.   MRN: [id number]  HPI He is here for complete examination. He now has a new job. He works 24 hours on an as 28 off in the [employer]. He does have underlying allergies and has some well-controlled. 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 He has noted increased difficulty with fatigue and stamina over the last several months. Psychologically things are going well. His libido is good. He continues on his thyroid medication without difficulty. He continues on Pravachol. 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 He has had difficulty over the last 3 weeks with nasal congestion, rhinorrhea, PND, slight cough and maxillary sinus pressure. His family and social history were reviewed. Immunizations and health maintenance were also reviewed.   Review of Systems  All other systems reviewed and are negative.      Objective:   Physical Exam BP 126/80 mmHg  Pulse 78  Ht 5' 8"  (1.727 m)  Wt 170 lb (77.111 kg)  BMI 25.85 kg/m2  SpO2 98%  General Appearance:    Alert, cooperative, no distress, appears stated age  Head:    Normocephalic, without obvious abnormality, atraumatic  Eyes:    PERRL, conjunctiva/corneas clear, EOM's intact, fundi    benign  Ears:    Normal TM's and external ear canals  Nose:   Nares normal, mucosa slightly red, no drainage , maxillary  sinus   Tenderness is noted   Throat:   Lips, mucosa, and tongue normal; teeth and gums normal  Neck:   Supple, no lymphadenopathy;  thyroid:  no   enlargement/tenderness/nodules; no carotid   bruit or JVD  Back:    Spine nontender, no curvature, ROM normal, no CVA     tenderness  Lungs:     Clear to auscult 2 small moles present one on the shaft and one on the pubic area.auscultation bilaterally without wheezes, rales or     ronchi; respirations unlabored  Chest Wall:    No tenderness  or deformity   Heart:    Regular rate and rhythm, S1 and S2 normal, no murmur, rub   or gallop  Breast Exam:    No chest wall tenderness, masses or gynecomastia  Abdomen:     Soft, non-tender, nondistended, normoactive bowel sounds,    no masses, no hepatosplenomegaly  Genitalia:    Normal male external genitalia with.  Testicles without masses.  No inguinal hernias.  Rectal:    Normal sphincter tone, no masses or tenderness; guaiac negative stool.  Prostate smooth, no nodules, not enlarged.  Extremities:   No clubbing, cyanosis or edema  Pulses:   2+ and symmetric all extremities  Skin:   Skin color, texture, turgor normal, no rashes or lesions  Lymph nodes:   Cervical, supraclavicular, and axillary nodes normal  Neurologic:   CNII-XII intact, normal strength, sensation and gait; reflexes 2+ and symmetric throughout          Psych:   Normal mood, affect, hygiene and grooming.          Assessment & Plan:  Routine general medical examination at a health care facility - Plan: CBC with Differential/Platelet, Comprehensive metabolic panel  Allergic rhinitis due to pollen  Exercise-induced asthma - Plan: albuterol (VENTOLIN HFA) 108 (90 BASE) MCG/ACT inhaler  Family history of heart disease in male family member before age 36 - Plan: pravastatin (PRAVACHOL)  20 MG tablet  History of herpes labialis - Plan: valACYclovir (VALTREX) 1000 MG tablet  History of Graves' disease - Plan: levothyroxine (SYNTHROID) 175 MCG tablet  Hypercholesterolemia - Plan: pravastatin (PRAVACHOL) 20 MG tablet  Acute maxillary sinusitis, recurrence not specified - Plan: amoxicillin (AMOXIL) 875 MG tablet  Need for prophylactic vaccination and inoculation against influenza - Plan: Flu Vaccine QUAD 36+ mos IM  Follow-up on his fatigue after we look at the blood work.

## 2014-12-13 ENCOUNTER — Telehealth: Payer: Self-pay | Admitting: Family Medicine

## 2014-12-13 NOTE — Telephone Encounter (Signed)
Pt returned call to someone to discuss labs. Did you call pt?

## 2015-03-05 ENCOUNTER — Other Ambulatory Visit: Payer: Self-pay | Admitting: Endocrinology

## 2015-06-03 ENCOUNTER — Other Ambulatory Visit: Payer: Self-pay | Admitting: Endocrinology

## 2015-06-26 ENCOUNTER — Ambulatory Visit (INDEPENDENT_AMBULATORY_CARE_PROVIDER_SITE_OTHER): Payer: Commercial Managed Care - HMO | Admitting: Family Medicine

## 2015-06-26 ENCOUNTER — Encounter: Payer: Self-pay | Admitting: Family Medicine

## 2015-06-26 VITALS — BP 120/70 | HR 68 | Wt 161.0 lb

## 2015-06-26 DIAGNOSIS — A630 Anogenital (venereal) warts: Secondary | ICD-10-CM | POA: Diagnosis not present

## 2015-06-26 DIAGNOSIS — M25562 Pain in left knee: Secondary | ICD-10-CM | POA: Diagnosis not present

## 2015-06-26 DIAGNOSIS — E7800 Pure hypercholesterolemia, unspecified: Secondary | ICD-10-CM

## 2015-06-26 DIAGNOSIS — E890 Postprocedural hypothyroidism: Secondary | ICD-10-CM

## 2015-06-26 DIAGNOSIS — Z8249 Family history of ischemic heart disease and other diseases of the circulatory system: Secondary | ICD-10-CM

## 2015-06-26 DIAGNOSIS — Z8639 Personal history of other endocrine, nutritional and metabolic disease: Secondary | ICD-10-CM

## 2015-06-26 LAB — LIPID PANEL
CHOLESTEROL: 176 mg/dL (ref 125–200)
HDL: 56 mg/dL (ref >=40)
LDL CALC: 105 mg/dL (ref <130)
Total CHOL/HDL Ratio: 3.1 Ratio (ref <=5.0)
Triglycerides: 76 mg/dL (ref <150)
VLDL: 15 mg/dL (ref <30)

## 2015-06-26 LAB — TSH: TSH: 1.743 u[IU]/mL (ref 0.350–4.500)

## 2015-06-26 NOTE — Progress Notes (Signed)
   Subjective:    Patient ID: Shawn Cook, male    DOB: 06-17-1971, 44 y.o.   MRN: [id number]  HPI He is here for an interval evaluation. He needs follow-up on his lipids as well as on his thyroid. He does have a previous history of Graves' disease and has had an ablation. He is having no difficulty with the medication in terms of change, skin or hair changes, cold intolerance. He also has a family history of heart disease and presently is on a statin. He does complain of left knee pain. He states that it started to bother him after he sprained his ankle. It is not interfering with his functioning. He points to the medial aspect of his knee. He also has 2 warts present, one on the shaft of the penis on the right and the other one in the pubic area at the base of the penis. He also is noted some increased difficulty with irritability and being short tempered. He cannot relate this to any particular stressful situation or necessarily work. He notes being short tempered with his children and wife and to a lesser extent at work.   Review of Systems     Objective:   Physical Exam Alert and in no distress. Exam of the left knee shows full motion. No tenderness to palpation over the joint line or patella. Anterior drawer and McMurray's testing negative. Exam of the penis does show 2 warts present in the above-mentioned area.       Assessment & Plan:  History of Graves' disease - Plan: TSH  Hypercholesterolemia - Plan: Lipid panel  Hypothyroidism, postablative - Plan: TSH  Penile venereal warts  Family history of heart disease in male family member before age 23 - Plan: Lipid panel  blood work ordered as mentioned. Since she's not having any difficulty with the knee, further intervention is not necessary. He is comfortable with this. The penile warts were injected with Xylocaine and hyfrecated without difficulty. He is to return here if he notes any residual. Also discussed the stress  that he is under and recommended AP more attention to this to see if there is any reason that he is more short tempered than normal. Did not feel further counseling and intervention will be needed.

## 2015-07-01 ENCOUNTER — Other Ambulatory Visit: Payer: Self-pay

## 2015-07-01 ENCOUNTER — Telehealth: Payer: Self-pay

## 2015-07-01 DIAGNOSIS — E7800 Pure hypercholesterolemia, unspecified: Secondary | ICD-10-CM

## 2015-07-01 DIAGNOSIS — Z8249 Family history of ischemic heart disease and other diseases of the circulatory system: Secondary | ICD-10-CM

## 2015-07-01 MED ORDER — PRAVASTATIN SODIUM 20 MG PO TABS: 20.0000 mg | ORAL_TABLET | Freq: Every day | ORAL | Status: DC

## 2015-07-01 MED ORDER — LEVOTHYROXINE SODIUM 175 MCG PO TABS: ORAL_TABLET | ORAL | Status: DC

## 2015-07-01 NOTE — Telephone Encounter (Signed)
Received refill request for Synthroid and Pravastatin.

## 2015-11-26 ENCOUNTER — Other Ambulatory Visit: Payer: Self-pay | Admitting: Family Medicine

## 2016-02-26 ENCOUNTER — Telehealth: Payer: Self-pay

## 2016-02-26 DIAGNOSIS — J45990 Exercise induced bronchospasm: Secondary | ICD-10-CM

## 2016-02-26 MED ORDER — ALBUTEROL SULFATE HFA 108 (90 BASE) MCG/ACT IN AERS: 2.0000 | INHALATION_SPRAY | Freq: Four times a day (QID) | RESPIRATORY_TRACT | Status: DC | PRN

## 2016-02-26 NOTE — Telephone Encounter (Signed)
Received faxed refill request for ventolin hfa 59mg inhaler

## 2016-03-20 ENCOUNTER — Telehealth: Payer: Self-pay | Admitting: Family Medicine

## 2016-03-20 DIAGNOSIS — J45990 Exercise induced bronchospasm: Secondary | ICD-10-CM

## 2016-03-20 MED ORDER — ALBUTEROL SULFATE HFA 108 (90 BASE) MCG/ACT IN AERS: 2.0000 | INHALATION_SPRAY | Freq: Four times a day (QID) | RESPIRATORY_TRACT | Status: DC | PRN

## 2016-03-20 NOTE — Telephone Encounter (Signed)
Per JCL ok to fill

## 2016-03-20 NOTE — Telephone Encounter (Signed)
Recv'd faxed refill request for Ventolin 90 mcg inhaler at local pharmacy  Left message for pt

## 2016-05-22 ENCOUNTER — Other Ambulatory Visit: Payer: Self-pay | Admitting: Family Medicine

## 2016-08-11 ENCOUNTER — Other Ambulatory Visit: Payer: Self-pay | Admitting: Family Medicine

## 2016-08-12 NOTE — Telephone Encounter (Signed)
LM for pt to CB to schedule med check for labs of TSH for further refills. RLB

## 2016-08-18 ENCOUNTER — Other Ambulatory Visit: Payer: Self-pay | Admitting: Family Medicine

## 2016-09-03 ENCOUNTER — Ambulatory Visit (INDEPENDENT_AMBULATORY_CARE_PROVIDER_SITE_OTHER): Payer: Commercial Managed Care - HMO | Admitting: Family Medicine

## 2016-09-03 ENCOUNTER — Encounter: Payer: Self-pay | Admitting: Family Medicine

## 2016-09-03 VITALS — BP 124/90 | HR 60 | Ht 68.0 in | Wt 152.0 lb

## 2016-09-03 DIAGNOSIS — Z8639 Personal history of other endocrine, nutritional and metabolic disease: Secondary | ICD-10-CM | POA: Diagnosis not present

## 2016-09-03 DIAGNOSIS — S39012A Strain of muscle, fascia and tendon of lower back, initial encounter: Secondary | ICD-10-CM | POA: Diagnosis not present

## 2016-09-03 DIAGNOSIS — Z8249 Family history of ischemic heart disease and other diseases of the circulatory system: Secondary | ICD-10-CM

## 2016-09-03 DIAGNOSIS — J0100 Acute maxillary sinusitis, unspecified: Secondary | ICD-10-CM

## 2016-09-03 DIAGNOSIS — Z8619 Personal history of other infectious and parasitic diseases: Secondary | ICD-10-CM

## 2016-09-03 DIAGNOSIS — E890 Postprocedural hypothyroidism: Secondary | ICD-10-CM | POA: Diagnosis not present

## 2016-09-03 DIAGNOSIS — J45990 Exercise induced bronchospasm: Secondary | ICD-10-CM | POA: Diagnosis not present

## 2016-09-03 DIAGNOSIS — J301 Allergic rhinitis due to pollen: Secondary | ICD-10-CM

## 2016-09-03 DIAGNOSIS — E7800 Pure hypercholesterolemia, unspecified: Secondary | ICD-10-CM | POA: Diagnosis not present

## 2016-09-03 LAB — COMPREHENSIVE METABOLIC PANEL
ALBUMIN: 4.4 g/dL (ref 3.6–5.1)
ALT: 20 U/L (ref 9–46)
AST: 24 U/L (ref 10–40)
Alkaline Phosphatase: 55 U/L (ref 40–115)
BUN: 14 mg/dL (ref 7–25)
CALCIUM: 10 mg/dL (ref 8.6–10.3)
CHLORIDE: 101 mmol/L (ref 98–110)
CO2: 27 mmol/L (ref 20–31)
CREATININE: 1.04 mg/dL (ref 0.60–1.35)
Glucose, Bld: 92 mg/dL (ref 65–99)
Potassium: 4.5 mmol/L (ref 3.5–5.3)
Sodium: 140 mmol/L (ref 135–146)
TOTAL PROTEIN: 7 g/dL (ref 6.1–8.1)
Total Bilirubin: 0.9 mg/dL (ref 0.2–1.2)

## 2016-09-03 LAB — CBC WITH DIFFERENTIAL/PLATELET
BASOS ABS: 0 {cells}/uL (ref 0–200)
Basophils Relative: 0 %
EOS ABS: 189 {cells}/uL (ref 15–500)
Eosinophils Relative: 3 %
HEMATOCRIT: 44.1 % (ref 38.5–50.0)
HEMOGLOBIN: 14.7 g/dL (ref 13.2–17.1)
LYMPHS ABS: 945 {cells}/uL (ref 850–3900)
Lymphocytes Relative: 15 %
MCH: 31 pg (ref 27.0–33.0)
MCHC: 33.3 g/dL (ref 32.0–36.0)
MCV: 93 fL (ref 80.0–100.0)
MONO ABS: 693 {cells}/uL (ref 200–950)
MPV: 10.3 fL (ref 7.5–12.5)
Monocytes Relative: 11 %
NEUTROS ABS: 4473 {cells}/uL (ref 1500–7800)
NEUTROS PCT: 71 %
Platelets: 142 10*3/uL (ref 140–400)
RBC: 4.74 MIL/uL (ref 4.20–5.80)
RDW: 13.4 % (ref 11.0–15.0)
WBC: 6.3 10*3/uL (ref 4.0–10.5)

## 2016-09-03 LAB — TSH: TSH: 0.82 mIU/L (ref 0.40–4.50)

## 2016-09-03 LAB — LIPID PANEL
CHOLESTEROL: 167 mg/dL (ref 125–200)
HDL: 54 mg/dL (ref >=40)
LDL CALC: 99 mg/dL (ref <130)
TRIGLYCERIDES: 69 mg/dL (ref <150)
Total CHOL/HDL Ratio: 3.1 Ratio (ref <=5.0)
VLDL: 14 mg/dL (ref <30)

## 2016-09-03 MED ORDER — AMOXICILLIN 875 MG PO TABS: 875.0000 mg | ORAL_TABLET | Freq: Two times a day (BID) | ORAL | 0 refills | Status: DC

## 2016-09-03 MED ORDER — ALBUTEROL SULFATE HFA 108 (90 BASE) MCG/ACT IN AERS: 2.0000 | INHALATION_SPRAY | Freq: Four times a day (QID) | RESPIRATORY_TRACT | 0 refills | Status: DC | PRN

## 2016-09-03 MED ORDER — VALACYCLOVIR HCL 1 G PO TABS: 1000.0000 mg | ORAL_TABLET | Freq: Every day | ORAL | 2 refills | Status: DC

## 2016-09-03 MED ORDER — PRAVASTATIN SODIUM 20 MG PO TABS
20.0000 mg | ORAL_TABLET | Freq: Every day | ORAL | 3 refills | Status: DC
Start: 2016-09-03 — End: 2017-08-05

## 2016-09-03 MED ORDER — LEVOTHYROXINE SODIUM 175 MCG PO TABS: 175.0000 ug | ORAL_TABLET | Freq: Every day | ORAL | 3 refills | Status: DC

## 2016-09-03 NOTE — Patient Instructions (Signed)
Heat for 20 minutes 3 times per day. Advil 4 tablets 3 times per day for pain relief. After you do that he do some gentle stretching. Proper posturing

## 2016-09-03 NOTE — Addendum Note (Signed)
Addended by: Denita Lung on: 09/03/2016 12:01 PM   Modules accepted: Orders

## 2016-09-03 NOTE — Progress Notes (Signed)
   Subjective:    Patient ID: Shawn Cook, male    DOB: [birth year], 45 y.o.   MRN: [id number]  HPI He is here for medication check. He does have underlying hypothyroidism from previous history of Graves' disease. He is having no skin or hair changes, hot or cold intolerance. He also has exercise-induced asthma as well as allergic rhinitis. The asthma usually bothers him more during the spring and fall. Presently he is having difficulty with nasal congestion, left maxillary sinus pain, PND, slight sore throat but no earache, fever, chills. He also is having some malaise and fatigue. He was recently working at [employer] and 3 days ago he noted some right sided low back pain but no numbness tingling or weakness.Marland Kitchen He also has a family history of heart disease and hyperlipidemia. He continues on Pravachol for that. He has had intermittent difficulty with herpes labialis and does use Valtrex with good results. Family and social history as well as health maintenance and immunizations were reviewed His work continues to go well.  Review of Systems     Objective:   Physical Exam Alert and in no distress. Nasal mucosa is slightly erythematous and boggy. Left maxillary sinus tenderness is noted. Tympanic membranes and canals are normal. Pharyngeal area is normal. Neck is supple without adenopathy or thyromegaly. Cardiac exam shows a regular sinus rhythm without murmurs or gallops. Lungs are clear to auscultation. Abdominal exam shows no masses or tenderness. Genitalia normal circumcised male.       Assessment & Plan:  Exercise-induced asthma - Plan: albuterol (VENTOLIN HFA) 108 (90 Base) MCG/ACT inhaler  History of Graves' disease - Plan: CBC with Differential/Platelet, Comprehensive metabolic panel, TSH  Hypercholesterolemia - Plan: CBC with Differential/Platelet, Comprehensive metabolic panel, Lipid panel, pravastatin (PRAVACHOL) 20 MG tablet  Hypothyroidism, postablative - Plan:  TSH, levothyroxine (SYNTHROID) 175 MCG tablet  Chronic seasonal allergic rhinitis due to pollen  History of herpes labialis - Plan: valACYclovir (VALTREX) 1000 MG tablet  Acute maxillary sinusitis, recurrence not specified - Plan: amoxicillin (AMOXIL) 875 MG tablet  Family history of heart disease in male family member before age 70 - Plan: pravastatin (PRAVACHOL) 20 MG tablet  Back strain, initial encounter Recommended conservative care for the back pain with heat, stretching and anti-inflammatory. He will continue on all his other medications. Amoxil was given with instructions on proper use and call if not entirely better. Stool cards also given. Over 40 minutes, greater than 50% spent in counseling and coordination of care.

## 2016-09-08 ENCOUNTER — Other Ambulatory Visit (INDEPENDENT_AMBULATORY_CARE_PROVIDER_SITE_OTHER): Payer: Commercial Managed Care - HMO

## 2016-09-08 DIAGNOSIS — Z1211 Encounter for screening for malignant neoplasm of colon: Secondary | ICD-10-CM | POA: Diagnosis not present

## 2016-09-08 LAB — HEMOCCULT GUIAC POC 1CARD (OFFICE)
Card #3 Fecal Occult Blood, POC: NEGATIVE
FECAL OCCULT BLD: NEGATIVE
Fecal Occult Blood, POC: NEGATIVE

## 2016-09-14 ENCOUNTER — Other Ambulatory Visit: Payer: Self-pay | Admitting: Family Medicine

## 2016-09-14 DIAGNOSIS — J45990 Exercise induced bronchospasm: Secondary | ICD-10-CM

## 2016-09-14 DIAGNOSIS — Z8619 Personal history of other infectious and parasitic diseases: Secondary | ICD-10-CM

## 2017-04-05 DIAGNOSIS — D1801 Hemangioma of skin and subcutaneous tissue: Secondary | ICD-10-CM | POA: Diagnosis not present

## 2017-04-05 DIAGNOSIS — C44612 Basal cell carcinoma of skin of right upper limb, including shoulder: Secondary | ICD-10-CM | POA: Diagnosis not present

## 2017-04-05 DIAGNOSIS — L814 Other melanin hyperpigmentation: Secondary | ICD-10-CM | POA: Diagnosis not present

## 2017-04-05 DIAGNOSIS — L821 Other seborrheic keratosis: Secondary | ICD-10-CM | POA: Diagnosis not present

## 2017-04-05 DIAGNOSIS — D485 Neoplasm of uncertain behavior of skin: Secondary | ICD-10-CM | POA: Diagnosis not present

## 2017-04-05 DIAGNOSIS — L570 Actinic keratosis: Secondary | ICD-10-CM | POA: Diagnosis not present

## 2017-04-21 ENCOUNTER — Encounter: Payer: Self-pay | Admitting: Family Medicine

## 2017-04-26 DIAGNOSIS — C44612 Basal cell carcinoma of skin of right upper limb, including shoulder: Secondary | ICD-10-CM | POA: Diagnosis not present

## 2017-04-26 DIAGNOSIS — D485 Neoplasm of uncertain behavior of skin: Secondary | ICD-10-CM | POA: Diagnosis not present

## 2017-04-26 DIAGNOSIS — C4441 Basal cell carcinoma of skin of scalp and neck: Secondary | ICD-10-CM | POA: Diagnosis not present

## 2017-04-26 DIAGNOSIS — L570 Actinic keratosis: Secondary | ICD-10-CM | POA: Diagnosis not present

## 2017-04-29 DIAGNOSIS — Z85828 Personal history of other malignant neoplasm of skin: Secondary | ICD-10-CM | POA: Insufficient documentation

## 2017-05-10 ENCOUNTER — Encounter: Payer: Self-pay | Admitting: Family Medicine

## 2017-05-10 DIAGNOSIS — C4441 Basal cell carcinoma of skin of scalp and neck: Secondary | ICD-10-CM

## 2017-05-27 DIAGNOSIS — D485 Neoplasm of uncertain behavior of skin: Secondary | ICD-10-CM | POA: Diagnosis not present

## 2017-05-27 DIAGNOSIS — C44311 Basal cell carcinoma of skin of nose: Secondary | ICD-10-CM | POA: Diagnosis not present

## 2017-06-04 ENCOUNTER — Encounter: Payer: Self-pay | Admitting: Family Medicine

## 2017-07-12 ENCOUNTER — Ambulatory Visit
Admission: RE | Admit: 2017-07-12 | Discharge: 2017-07-12 | Disposition: A | Discharge status: Home / Self Care | Payer: Worker's Compensation | Source: Ambulatory Visit | Attending: Nurse Practitioner | Admitting: Nurse Practitioner

## 2017-07-12 ENCOUNTER — Other Ambulatory Visit: Payer: Self-pay | Admitting: Nurse Practitioner

## 2017-07-12 DIAGNOSIS — S99921A Unspecified injury of right foot, initial encounter: Secondary | ICD-10-CM

## 2017-07-19 ENCOUNTER — Other Ambulatory Visit: Payer: Self-pay | Admitting: Family Medicine

## 2017-07-19 DIAGNOSIS — E890 Postprocedural hypothyroidism: Secondary | ICD-10-CM

## 2017-08-04 DIAGNOSIS — C44311 Basal cell carcinoma of skin of nose: Secondary | ICD-10-CM | POA: Diagnosis not present

## 2017-08-05 ENCOUNTER — Other Ambulatory Visit: Payer: Self-pay | Admitting: Family Medicine

## 2017-08-05 DIAGNOSIS — Z8249 Family history of ischemic heart disease and other diseases of the circulatory system: Secondary | ICD-10-CM

## 2017-08-05 DIAGNOSIS — E7800 Pure hypercholesterolemia, unspecified: Secondary | ICD-10-CM

## 2017-08-11 ENCOUNTER — Telehealth: Payer: Self-pay | Admitting: Family Medicine

## 2017-08-11 NOTE — Telephone Encounter (Signed)
Pt came in and stated that he received labs thru his job. He was questions about the low values. Please call pt at [phone]. Sending labs back for review.

## 2017-08-13 ENCOUNTER — Encounter: Payer: Self-pay | Admitting: Family Medicine

## 2017-09-15 ENCOUNTER — Encounter: Payer: Self-pay | Admitting: Family Medicine

## 2017-09-15 ENCOUNTER — Ambulatory Visit (INDEPENDENT_AMBULATORY_CARE_PROVIDER_SITE_OTHER): Payer: 59 | Admitting: Family Medicine

## 2017-09-15 VITALS — BP 130/2 | HR 52 | Ht 67.5 in | Wt 156.2 lb

## 2017-09-15 DIAGNOSIS — Z8249 Family history of ischemic heart disease and other diseases of the circulatory system: Secondary | ICD-10-CM

## 2017-09-15 DIAGNOSIS — M722 Plantar fascial fibromatosis: Secondary | ICD-10-CM | POA: Diagnosis not present

## 2017-09-15 DIAGNOSIS — Z8619 Personal history of other infectious and parasitic diseases: Secondary | ICD-10-CM

## 2017-09-15 DIAGNOSIS — J45990 Exercise induced bronchospasm: Secondary | ICD-10-CM

## 2017-09-15 DIAGNOSIS — Z8639 Personal history of other endocrine, nutritional and metabolic disease: Secondary | ICD-10-CM

## 2017-09-15 DIAGNOSIS — J301 Allergic rhinitis due to pollen: Secondary | ICD-10-CM

## 2017-09-15 DIAGNOSIS — E890 Postprocedural hypothyroidism: Secondary | ICD-10-CM | POA: Diagnosis not present

## 2017-09-15 DIAGNOSIS — Z85828 Personal history of other malignant neoplasm of skin: Secondary | ICD-10-CM

## 2017-09-15 DIAGNOSIS — Z0000 Encounter for general adult medical examination without abnormal findings: Secondary | ICD-10-CM | POA: Diagnosis not present

## 2017-09-15 DIAGNOSIS — E7800 Pure hypercholesterolemia, unspecified: Secondary | ICD-10-CM | POA: Diagnosis not present

## 2017-09-15 LAB — COMPREHENSIVE METABOLIC PANEL
AG RATIO: 1.8 (calc) (ref 1.0–2.5)
ALKALINE PHOSPHATASE (APISO): 53 U/L (ref 40–115)
ALT: 28 U/L (ref 9–46)
AST: 34 U/L (ref 10–40)
Albumin: 4.6 g/dL (ref 3.6–5.1)
BILIRUBIN TOTAL: 0.7 mg/dL (ref 0.2–1.2)
BUN: 12 mg/dL (ref 7–25)
CALCIUM: 9.7 mg/dL (ref 8.6–10.3)
CO2: 30 mmol/L (ref 20–32)
Chloride: 101 mmol/L (ref 98–110)
Creat: 1.17 mg/dL (ref 0.60–1.35)
Globulin: 2.5 g/dL (calc) (ref 1.9–3.7)
Glucose, Bld: 97 mg/dL (ref 65–99)
Potassium: 4.3 mmol/L (ref 3.5–5.3)
Sodium: 140 mmol/L (ref 135–146)
Total Protein: 7.1 g/dL (ref 6.1–8.1)

## 2017-09-15 LAB — POCT URINALYSIS DIP (PROADVANTAGE DEVICE)
BILIRUBIN UA: NEGATIVE
BILIRUBIN UA: NEGATIVE mg/dL
Glucose, UA: NEGATIVE mg/dL
LEUKOCYTES UA: NEGATIVE
Nitrite, UA: NEGATIVE
PROTEIN UA: NEGATIVE mg/dL
RBC UA: NEGATIVE
Specific Gravity, Urine: 1.01
UUROB: NEGATIVE
pH, UA: 7.5 (ref 5.0–8.0)

## 2017-09-15 LAB — CBC WITH DIFFERENTIAL/PLATELET
BASOS ABS: 32 {cells}/uL (ref 0–200)
Basophils Relative: 0.9 %
EOS ABS: 130 {cells}/uL (ref 15–500)
Eosinophils Relative: 3.7 %
HEMATOCRIT: 41.3 % (ref 38.5–50.0)
HEMOGLOBIN: 14.1 g/dL (ref 13.2–17.1)
Lymphs Abs: 1099 cells/uL (ref 850–3900)
MCH: 30.7 pg (ref 27.0–33.0)
MCHC: 34.1 g/dL (ref 32.0–36.0)
MCV: 90 fL (ref 80.0–100.0)
MPV: 11.6 fL (ref 7.5–12.5)
Monocytes Relative: 10.3 %
NEUTROS ABS: 1880 {cells}/uL (ref 1500–7800)
NEUTROS PCT: 53.7 %
Platelets: 180 10*3/uL (ref 140–400)
RBC: 4.59 10*6/uL (ref 4.20–5.80)
RDW: 12.8 % (ref 11.0–15.0)
Total Lymphocyte: 31.4 %
WBC mixed population: 361 cells/uL (ref 200–950)
WBC: 3.5 10*3/uL — ABNORMAL LOW (ref 3.8–10.8)

## 2017-09-15 LAB — LIPID PANEL
CHOL/HDL RATIO: 2.9 (calc) (ref <5.0)
CHOLESTEROL: 178 mg/dL (ref <200)
HDL: 62 mg/dL (ref >40)
LDL Cholesterol (Calc): 102 mg/dL (calc) — ABNORMAL HIGH
NON-HDL CHOLESTEROL (CALC): 116 mg/dL (ref <130)
TRIGLYCERIDES: 54 mg/dL (ref <150)

## 2017-09-15 LAB — TSH: TSH: 0.4 mIU/L (ref 0.40–4.50)

## 2017-09-15 MED ORDER — PRAVASTATIN SODIUM 20 MG PO TABS: 20.0000 mg | ORAL_TABLET | Freq: Every day | ORAL | 3 refills | Status: DC

## 2017-09-15 MED ORDER — LEVOTHYROXINE SODIUM 175 MCG PO TABS: 175.0000 ug | ORAL_TABLET | Freq: Every day | ORAL | 3 refills | Status: DC

## 2017-09-15 NOTE — Progress Notes (Signed)
Subjective:    Patient ID: Shawn Cook, male    DOB: 1971-07-01, 46 y.o.   MRN: [id number]  HPI He is here for complete examination. 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 He does have underlying allergies as well as exercise-induced asthma. He does use allergy meds on an as-needed basis as well as an inhaler as needed. He continues on thyroid replacement secondary to ablation from history of Graves' disease. Does have a positive family history for heart disease and continues on a statin drug without difficulty. He does occasionally have an outbreak of herpes labialis but does have Valtrex available. His work and home life are going quite well. He has had some intermittent difficulty with decreased energy but shortly after that can function is normal level. He does run regularly and has maintained his current pace. He has had some difficulty with left heel pain. Family and social history as well as health maintenance and immunizations was reviewed   Review of Systems  All other systems reviewed and are negative.      Objective:   Physical Exam BP (!) 130/2   Pulse (!) 52   Ht 5' 7.5" (1.715 m)   Wt 156 lb 3.2 oz (70.9 kg)   SpO2 99%   BMI 24.10 kg/m   General Appearance:    Alert, cooperative, no distress, appears stated age  Head:    Normocephalic, without obvious abnormality, atraumatic  Eyes:    PERRL, conjunctiva/corneas clear, EOM's intact, fundi    benign  Ears:    Normal TM's and external ear canals  Nose:   Nares normal, mucosa normal, no drainage or sinus   tenderness. Scarring noted to the left bridge of the nose from previous Mohs surgery   Throat:   Lips, mucosa, and tongue normal; teeth and gums normal  Neck:   Supple, no lymphadenopathy;  thyroid:  no   enlargement/tenderness/nodules; no carotid   bruit or JVD     Lungs:     Clear to auscultation bilaterally without wheezes, rales or     ronchi; respirations  unlabored      Heart:    Regular rate and rhythm, S1 and S2 normal, no murmur, rub   or gallop     Abdomen:     Soft, non-tender, nondistended, normoactive bowel sounds,    no masses, no hepatosplenomegaly  Genitalia:    Normal male external genitalia without lesions.  Testicles without masses.  No inguinal hernias.  Rectal:   Deferred. Guaiac cards given   Extremities:   No clubbing, cyanosis or edema. Tender to palpation over the left calcaneal spur.   Pulses:   2+ and symmetric all extremities  Skin:   Skin color, texture, turgor normal, no rashes or lesions  Lymph nodes:   Cervical, supraclavicular, and axillary nodes normal  Neurologic:   CNII-XII intact, normal strength, sensation and gait; reflexes 2+ and symmetric throughout          Psych:   Normal mood, affect, hygiene and grooming.           Assessment & Plan:  Routine general medical examination at a health care facility - Plan: POCT Urinalysis DIP (Proadvantage Device), CBC with Differential/Platelet, Comprehensive metabolic panel, Lipid panel  Family history of heart disease in male family member before age 19 - Plan: pravastatin (PRAVACHOL) 20 MG tablet  Hypercholesterolemia - Plan: pravastatin (PRAVACHOL) 20 MG tablet, Lipid panel  Hypothyroidism,  postablative - Plan: levothyroxine (SYNTHROID) 175 MCG tablet, TSH  History of herpes labialis  Exercise-induced asthma  Seasonal allergic rhinitis due to pollen  History of Graves' disease - Plan: TSH  History of basal cell carcinoma  Plantar fasciitis of left foot Encouraged him to continue with his active lifestyle. Also discussed care for his plantar fasciitis with proper stretching and physical therapy to the heel with kneeding. Also discussed use of arch supports or heel cups. He will return here if he continues have difficulty. Will continue on his present medication regimen. Also discusses risk of heart disease. Since he keeps himself in good physical  shape no further intervention needed at this point. Discussed signs and symptoms to be concerned about in relation to cardiac disease.

## 2017-12-27 ENCOUNTER — Telehealth: Payer: Self-pay | Admitting: Family Medicine

## 2017-12-27 NOTE — Telephone Encounter (Signed)
[facility] req Synthroid tab     Last cpe 09/15/17

## 2017-12-29 ENCOUNTER — Other Ambulatory Visit: Payer: Self-pay

## 2017-12-29 ENCOUNTER — Telehealth: Payer: Self-pay | Admitting: Family Medicine

## 2017-12-29 DIAGNOSIS — E890 Postprocedural hypothyroidism: Secondary | ICD-10-CM

## 2017-12-29 MED ORDER — LEVOTHYROXINE SODIUM 175 MCG PO TABS: 175.0000 ug | ORAL_TABLET | Freq: Every day | ORAL | 3 refills | Status: DC

## 2017-12-29 NOTE — Telephone Encounter (Signed)
[facility] req Synthroid Tab

## 2017-12-29 NOTE — Telephone Encounter (Signed)
Pt called & states he is out of his medicine today and will not get his mail order for a few days.   Samples #14 100 mcg and #14 75 mcg given ok per Dr. Redmond School

## 2017-12-29 NOTE — Telephone Encounter (Signed)
Done KH 

## 2018-03-28 ENCOUNTER — Telehealth: Payer: Self-pay

## 2018-03-28 DIAGNOSIS — E890 Postprocedural hypothyroidism: Secondary | ICD-10-CM

## 2018-03-28 MED ORDER — LEVOTHYROXINE SODIUM 175 MCG PO TABS: 175.0000 ug | ORAL_TABLET | Freq: Every day | ORAL | 1 refills | Status: DC

## 2018-03-28 NOTE — Telephone Encounter (Signed)
Received voicemail message that patient would like a refill on Synthroid for 90 day supply for 1 year through [facility].  Patient can be contacted at [phone].  Is this ok?

## 2018-04-07 ENCOUNTER — Telehealth: Payer: Self-pay

## 2018-04-07 NOTE — Telephone Encounter (Signed)
Left message on voicemail for patient to call back about his synthroid RX.  We got a [facility] new prescription request.

## 2018-04-12 DIAGNOSIS — L821 Other seborrheic keratosis: Secondary | ICD-10-CM | POA: Diagnosis not present

## 2018-04-12 DIAGNOSIS — D225 Melanocytic nevi of trunk: Secondary | ICD-10-CM | POA: Diagnosis not present

## 2018-04-12 DIAGNOSIS — L814 Other melanin hyperpigmentation: Secondary | ICD-10-CM | POA: Diagnosis not present

## 2018-04-12 DIAGNOSIS — L570 Actinic keratosis: Secondary | ICD-10-CM | POA: Diagnosis not present

## 2018-06-13 ENCOUNTER — Other Ambulatory Visit: Payer: Self-pay

## 2018-06-13 ENCOUNTER — Telehealth: Payer: Self-pay | Admitting: Family Medicine

## 2018-06-13 DIAGNOSIS — E890 Postprocedural hypothyroidism: Secondary | ICD-10-CM

## 2018-06-13 MED ORDER — LEVOTHYROXINE SODIUM 175 MCG PO TABS: 175.0000 ug | ORAL_TABLET | Freq: Every day | ORAL | 0 refills | Status: DC

## 2018-06-13 NOTE — Telephone Encounter (Signed)
Done and pt called . [place]

## 2018-06-13 NOTE — Telephone Encounter (Signed)
Pt would like refill on his Synthroid, has CPE 09/16/18, said would like 90 days, said last 2 refills have been only #30 and doesn't know why, it cost so must more and was told labs were good and to return for CPE 11/19 and would like 90 day refill this time.

## 2018-06-19 NOTE — Telephone Encounter (Signed)
done

## 2018-07-25 ENCOUNTER — Ambulatory Visit: Payer: 59 | Admitting: Family Medicine

## 2018-07-25 ENCOUNTER — Encounter: Payer: Self-pay | Admitting: Family Medicine

## 2018-07-25 VITALS — BP 128/88 | HR 61 | Temp 97.3°F | Wt 165.2 lb

## 2018-07-25 DIAGNOSIS — E890 Postprocedural hypothyroidism: Secondary | ICD-10-CM | POA: Diagnosis not present

## 2018-07-25 DIAGNOSIS — E7800 Pure hypercholesterolemia, unspecified: Secondary | ICD-10-CM

## 2018-07-25 DIAGNOSIS — R079 Chest pain, unspecified: Secondary | ICD-10-CM | POA: Diagnosis not present

## 2018-07-25 DIAGNOSIS — J45990 Exercise induced bronchospasm: Secondary | ICD-10-CM

## 2018-07-25 DIAGNOSIS — R531 Weakness: Secondary | ICD-10-CM

## 2018-07-25 DIAGNOSIS — Z8249 Family history of ischemic heart disease and other diseases of the circulatory system: Secondary | ICD-10-CM | POA: Diagnosis not present

## 2018-07-25 DIAGNOSIS — Z23 Encounter for immunization: Secondary | ICD-10-CM

## 2018-07-25 NOTE — Progress Notes (Signed)
   Subjective:    Patient ID: Shawn Cook, male    DOB: Jan 02, 1971, 47 y.o.   MRN: [id number]  HPI He is here for consult concerning difficulty he has noted with running.  Since last October he has noted his running times have increased.  He was doing a 9-minute mile and is now up to 12-minute mile and having difficulty running the same distance.  Recently he is also noted some slight chest discomfort with running.  He does have an underlying history of exercise-induced asthma and has been using the inhaler without difficulty.  It does not make him hyper.  He does not smoke.  Does have a family history of heart disease.  He was last seen by cardiology in 2014.  He also is on thyroid medication and a statin   Review of Systems     Objective:   Physical Exam Alert and in no distress. Tympanic membranes and canals are normal. Pharyngeal area is normal. Neck is supple without adenopathy or thyromegaly. Cardiac exam shows a regular sinus rhythm without murmurs or gallops. Lungs are clear to auscultation. EKG shows no acute changes       Assessment & Plan:  Need for influenza vaccination - Plan: Flu Vaccine QUAD 6+ mos PF IM (Fluarix Quad PF)  Hypothyroidism, postablative - Plan: CBC with Differential/Platelet, Comprehensive metabolic panel, TSH  Family history of heart disease in male family member before age 24 - Plan: EKG 12-Lead, CBC with Differential/Platelet, Comprehensive metabolic panel, Lipid panel, Ambulatory referral to Cardiology  Hypercholesterolemia - Plan: Lipid panel  Exercise-induced asthma  Chest pain, unspecified type - Plan: EKG 12-Lead, CBC with Differential/Platelet, Comprehensive metabolic panel, Ambulatory referral to Cardiology  Decreased strength - Plan: Testosterone I will do routine blood screening and also referred to cardiology.  The increase in his running times has been concerned about underlying heart condition.  Might be time for a  catheterization.

## 2018-07-26 ENCOUNTER — Encounter: Payer: Self-pay | Admitting: Family Medicine

## 2018-07-26 ENCOUNTER — Other Ambulatory Visit: Payer: Self-pay

## 2018-07-26 DIAGNOSIS — J45990 Exercise induced bronchospasm: Secondary | ICD-10-CM

## 2018-07-26 LAB — CBC WITH DIFFERENTIAL/PLATELET
Basophils Absolute: 0 10*3/uL (ref 0.0–0.2)
Basos: 1 %
EOS (ABSOLUTE): 0.2 10*3/uL (ref 0.0–0.4)
Eos: 6 %
HEMATOCRIT: 41.6 % (ref 37.5–51.0)
Hemoglobin: 14 g/dL (ref 13.0–17.7)
IMMATURE GRANULOCYTES: 0 %
Immature Grans (Abs): 0 10*3/uL (ref 0.0–0.1)
Lymphocytes Absolute: 1.2 10*3/uL (ref 0.7–3.1)
Lymphs: 32 %
MCH: 30.4 pg (ref 26.6–33.0)
MCHC: 33.7 g/dL (ref 31.5–35.7)
MCV: 90 fL (ref 79–97)
Monocytes Absolute: 0.4 10*3/uL (ref 0.1–0.9)
Monocytes: 12 %
NEUTROS ABS: 1.8 10*3/uL (ref 1.4–7.0)
Neutrophils: 49 %
Platelets: 160 10*3/uL (ref 150–450)
RBC: 4.61 x10E6/uL (ref 4.14–5.80)
RDW: 14.6 % (ref 12.3–15.4)
WBC: 3.8 10*3/uL (ref 3.4–10.8)

## 2018-07-26 LAB — COMPREHENSIVE METABOLIC PANEL
A/G RATIO: 1.8 (ref 1.2–2.2)
ALT: 26 IU/L (ref 0–44)
AST: 28 IU/L (ref 0–40)
Albumin: 4.4 g/dL (ref 3.5–5.5)
Alkaline Phosphatase: 59 IU/L (ref 39–117)
BILIRUBIN TOTAL: 0.4 mg/dL (ref 0.0–1.2)
BUN / CREAT RATIO: 14 (ref 9–20)
BUN: 17 mg/dL (ref 6–24)
CALCIUM: 9.5 mg/dL (ref 8.7–10.2)
CO2: 25 mmol/L (ref 20–29)
Chloride: 100 mmol/L (ref 96–106)
Creatinine, Ser: 1.25 mg/dL (ref 0.76–1.27)
GFR, EST AFRICAN AMERICAN: 79 mL/min/{1.73_m2} (ref >59)
GFR, EST NON AFRICAN AMERICAN: 69 mL/min/{1.73_m2} (ref >59)
GLOBULIN, TOTAL: 2.4 g/dL (ref 1.5–4.5)
Glucose: 97 mg/dL (ref 65–99)
POTASSIUM: 4.5 mmol/L (ref 3.5–5.2)
SODIUM: 140 mmol/L (ref 134–144)
TOTAL PROTEIN: 6.8 g/dL (ref 6.0–8.5)

## 2018-07-26 LAB — LIPID PANEL
CHOL/HDL RATIO: 3.5 ratio (ref 0.0–5.0)
Cholesterol, Total: 199 mg/dL (ref 100–199)
HDL: 57 mg/dL (ref >39)
LDL Calculated: 115 mg/dL — ABNORMAL HIGH (ref 0–99)
Triglycerides: 136 mg/dL (ref 0–149)
VLDL Cholesterol Cal: 27 mg/dL (ref 5–40)

## 2018-07-26 LAB — TESTOSTERONE: TESTOSTERONE: 274 ng/dL (ref 264–916)

## 2018-07-26 LAB — TSH: TSH: 1.38 u[IU]/mL (ref 0.450–4.500)

## 2018-07-26 MED ORDER — ALBUTEROL SULFATE HFA 108 (90 BASE) MCG/ACT IN AERS: INHALATION_SPRAY | RESPIRATORY_TRACT | 1 refills | Status: DC

## 2018-07-26 NOTE — Telephone Encounter (Signed)
Pt is requesting a refill on Ventolin. Please advise

## 2018-07-26 NOTE — Addendum Note (Signed)
Addended by: Denita Lung on: 07/26/2018 08:27 AM   Modules accepted: Orders

## 2018-08-11 NOTE — Progress Notes (Signed)
Cardiology Office Note   Date:  08/15/2018   ID:  Shawn Cook, DOB 1971-02-03, MRN [id number]  PCP:  Denita Lung, MD  Cardiologist:   Peter Martinique, MD   Chief Complaint  Patient presents with  . Chest Pain  . Hyperlipidemia      History of Present Illness: Shawn Cook is a 47 y.o. male who is seen at the request of Dr. Redmond School for evaluation of chest pain. He has a history of HLD and family history of heart disease. His mother had a myocardial infarction at age 38. She subsequently had bypass surgery and ended up dying at age 75. She was a heavy smoker. His father died at age 44 of suicide. There apparently is a strong history of heart disease on his paternal side of the family. He was seen by me in 2014 and risk factor modification recommended.   He runs regularly but notes over the past 2 years he seems to be slowing down a lot. Used to run half marathons but now runs more 5 Ks. His times have dropped by over one minute. He has a dull ache below his left nipple that intensifies with running. He notes with his watch that his HR maxes out 185-200 bpm within about 4 minutes and stays elevated throughout his run. Feels more fatigue and exhaustion. Can rarely run more than 5 miles. He does have a history of asthma and will occasionally use his albuterol before running.     Past Medical History:  Diagnosis Date  . Allergic rhinitis   . Asthma   . BCE (basal cell epithelioma)    nose  . Dyslipidemia   . FHx: cardiovascular disease   . Grave's disease   . Herpes labialis     Past Surgical History:  Procedure Laterality Date  . rotator cuff surgery Left 06/2013  . VASECTOMY  12/2010     Current Outpatient Medications  Medication Sig Dispense Refill  . albuterol (VENTOLIN HFA) 108 (90 Base) MCG/ACT inhaler USE 2 PUFFS EVERY 6 HOURS  AS NEEDED FOR WHEEZING 18 g 1  . levothyroxine (SYNTHROID) 175 MCG tablet Take 1 tablet (175 mcg total) by mouth daily. 90  tablet 0  . Multiple Vitamin (MULTIVITAMIN WITH MINERALS) TABS tablet Take 1 tablet by mouth daily.    . pravastatin (PRAVACHOL) 20 MG tablet Take 1 tablet (20 mg total) by mouth daily. 90 tablet 3   No current facility-administered medications for this visit.     Allergies:   Shellfish allergy    Social History:  The patient  reports that he has never smoked. He has never used smokeless tobacco. He reports that he drinks about 2.0 standard drinks of alcohol per week. He reports that he does not use drugs.   Family History:  The patient's family history includes Early death in his mother; Heart disease (age of onset: 67) in his mother.    ROS:  Please see the history of present illness.   Otherwise, review of systems are positive for none.   All other systems are reviewed and negative.    PHYSICAL EXAM: VS:  BP 138/78 (BP Location: Right Arm)   Pulse 63   Ht 5' 7.5" (1.715 m)   Wt 166 lb (75.3 kg)   BMI 25.62 kg/m  , BMI Body mass index is 25.62 kg/m. GEN: Well nourished, well developed, in no acute distress  HEENT: normal  Neck: no JVD, carotid bruits, or masses Cardiac:  RRR; no murmurs, rubs, or gallops,no edema  Respiratory:  clear to auscultation bilaterally, normal work of breathing GI: soft, nontender, nondistended, + BS MS: no deformity or atrophy  Skin: warm and dry, no rash Neuro:  Strength and sensation are intact Psych: euthymic mood, full affect   EKG:  EKG is not ordered today. The ekg ordered 07/26/18 demonstrates  NSR with normal Ecg.   Recent Labs: 07/25/2018: ALT 26; BUN 17; Creatinine, Ser 1.25; Hemoglobin 14.0; Platelets 160; Potassium 4.5; Sodium 140; TSH 1.380    Lipid Panel    Component Value Date/Time   CHOL 199 07/25/2018 0951   TRIG 136 07/25/2018 0951   HDL 57 07/25/2018 0951   CHOLHDL 3.5 07/25/2018 0951   CHOLHDL 2.9 09/15/2017 1049   VLDL 14 09/03/2016 1046   LDLCALC 115 (H) 07/25/2018 0951   LDLCALC 102 (H) 09/15/2017 1049    LDLDIRECT 151.6 07/18/2013 1029      Wt Readings from Last 3 Encounters:  08/15/18 166 lb (75.3 kg)  07/25/18 165 lb 3.2 oz (74.9 kg)  09/15/17 156 lb 3.2 oz (70.9 kg)      Other studies Reviewed: Additional studies/ records that were reviewed today include: none   ASSESSMENT AND PLAN:  1.  Exertional fatigue and chest pain. Risk factors of hypercholesterolemia and strong family history of CAD. I recommend we evaluate with a ETT.    Current medicines are reviewed at length with the patient today.  The patient does not have concerns regarding medicines.  The following changes have been made:  no change  Labs/ tests ordered today include:   Orders Placed This Encounter  Procedures  . Exercise Tolerance Test      Signed, Peter Martinique, MD  08/15/2018 2:57 PM    [facility] [address] [place], [place], [address] Phone [phone], Fax [phone]

## 2018-08-15 ENCOUNTER — Ambulatory Visit: Payer: 59 | Admitting: Cardiology

## 2018-08-15 ENCOUNTER — Encounter: Payer: Self-pay | Admitting: Cardiology

## 2018-08-15 VITALS — BP 138/78 | HR 63 | Ht 67.5 in | Wt 166.0 lb

## 2018-08-15 DIAGNOSIS — R079 Chest pain, unspecified: Secondary | ICD-10-CM | POA: Diagnosis not present

## 2018-08-15 DIAGNOSIS — E7800 Pure hypercholesterolemia, unspecified: Secondary | ICD-10-CM

## 2018-08-15 DIAGNOSIS — R5383 Other fatigue: Secondary | ICD-10-CM | POA: Diagnosis not present

## 2018-08-15 NOTE — Patient Instructions (Signed)
We will schedule you for a stress test

## 2018-08-16 ENCOUNTER — Telehealth (HOSPITAL_COMMUNITY): Payer: Self-pay | Admitting: *Deleted

## 2018-08-16 NOTE — Telephone Encounter (Signed)
Close encounter 

## 2018-08-17 ENCOUNTER — Telehealth (HOSPITAL_COMMUNITY): Payer: Self-pay

## 2018-08-17 ENCOUNTER — Ambulatory Visit (HOSPITAL_COMMUNITY)
Admission: RE | Admit: 2018-08-17 | Discharge: 2018-08-17 | Disposition: A | Discharge status: Home / Self Care | Payer: 59 | Source: Ambulatory Visit | Attending: Internal Medicine | Admitting: Internal Medicine

## 2018-08-17 DIAGNOSIS — E7800 Pure hypercholesterolemia, unspecified: Secondary | ICD-10-CM | POA: Diagnosis not present

## 2018-08-17 DIAGNOSIS — R5383 Other fatigue: Secondary | ICD-10-CM | POA: Diagnosis not present

## 2018-08-17 DIAGNOSIS — R079 Chest pain, unspecified: Secondary | ICD-10-CM | POA: Diagnosis not present

## 2018-08-17 NOTE — Telephone Encounter (Signed)
Encounter complete. 

## 2018-08-18 LAB — EXERCISE TOLERANCE TEST
CHL CUP MPHR: 174 {beats}/min
CHL CUP RESTING HR STRESS: 59 {beats}/min
CHL RATE OF PERCEIVED EXERTION: 19
CSEPEDS: 1 s
Estimated workload: 17.2 METS
Exercise duration (min): 15 min
Peak HR: 184 {beats}/min
Percent HR: 105 %

## 2018-09-05 ENCOUNTER — Other Ambulatory Visit: Payer: Self-pay | Admitting: Family Medicine

## 2018-09-05 DIAGNOSIS — Z8249 Family history of ischemic heart disease and other diseases of the circulatory system: Secondary | ICD-10-CM

## 2018-09-05 DIAGNOSIS — E7800 Pure hypercholesterolemia, unspecified: Secondary | ICD-10-CM

## 2018-09-15 ENCOUNTER — Other Ambulatory Visit: Payer: Self-pay | Admitting: Family Medicine

## 2018-09-15 DIAGNOSIS — E890 Postprocedural hypothyroidism: Secondary | ICD-10-CM

## 2018-09-16 ENCOUNTER — Ambulatory Visit (INDEPENDENT_AMBULATORY_CARE_PROVIDER_SITE_OTHER): Payer: 59 | Admitting: Family Medicine

## 2018-09-16 ENCOUNTER — Encounter: Payer: Self-pay | Admitting: Family Medicine

## 2018-09-16 VITALS — BP 120/78 | HR 57 | Temp 97.6°F | Ht 67.0 in | Wt 165.2 lb

## 2018-09-16 DIAGNOSIS — J45990 Exercise induced bronchospasm: Secondary | ICD-10-CM

## 2018-09-16 DIAGNOSIS — Z8249 Family history of ischemic heart disease and other diseases of the circulatory system: Secondary | ICD-10-CM | POA: Diagnosis not present

## 2018-09-16 DIAGNOSIS — Z0000 Encounter for general adult medical examination without abnormal findings: Secondary | ICD-10-CM

## 2018-09-16 DIAGNOSIS — E7800 Pure hypercholesterolemia, unspecified: Secondary | ICD-10-CM

## 2018-09-16 DIAGNOSIS — E890 Postprocedural hypothyroidism: Secondary | ICD-10-CM

## 2018-09-16 LAB — POCT URINALYSIS DIP (PROADVANTAGE DEVICE)
Bilirubin, UA: NEGATIVE
Blood, UA: NEGATIVE
GLUCOSE UA: NEGATIVE mg/dL
Ketones, POC UA: NEGATIVE mg/dL
LEUKOCYTES UA: NEGATIVE
NITRITE UA: NEGATIVE
PROTEIN UA: NEGATIVE mg/dL
Specific Gravity, Urine: 1.01
UUROB: 3.5
pH, UA: 7 (ref 5.0–8.0)

## 2018-09-16 MED ORDER — PRAVASTATIN SODIUM 40 MG PO TABS: 40.0000 mg | ORAL_TABLET | Freq: Every day | ORAL | 3 refills | Status: DC

## 2018-09-16 NOTE — Patient Instructions (Signed)
For your seasonal allergy start with Flonase and then add Zyrtec if you need to.  10 you on albuterol for asthma especially with running.  Make sure you use your inhaler before your next runs

## 2018-09-16 NOTE — Progress Notes (Signed)
Subjective:    Patient ID: Shawn Cook, male    DOB: 1971-08-19, 47 y.o.   MRN: [id number]  HPI He is here for recheck.  He was recently seen by cardiology and a stress test was done which was negative except for hypersensitive response.  He has started running again but not necessarily competitively.  He does have a race coming up over the Thanksgiving weekend.  Normally he runs a 24-minute 5K and recently went up to 26 minutes.  He continues on his thyroid medication is very happy with the response to that.  He does have underlying allergies as well as seasonal asthma and exercise-induced asthma.  He does use albuterol.  Does have concerns over the plantar surface of his left foot and does feel a lesion in that area.  It is causing no difficulty.  Otherwise he has no particular concerns or complaints.  His work and home life are going well.  Family and social history as well as health maintenance and immunizations was reviewed.   Review of Systems  All other systems reviewed and are negative.      Objective:   Physical Exam BP 120/78 (BP Location: Left Arm, Patient Position: Sitting)   Pulse (!) 57   Temp 97.6 F (36.4 C)   Ht 5' 7"  (1.702 m)   Wt 165 lb 3.2 oz (74.9 kg)   SpO2 98%   BMI 25.87 kg/m   General Appearance:    Alert, cooperative, no distress, appears stated age  Head:    Normocephalic, without obvious abnormality, atraumatic  Eyes:    PERRL, conjunctiva/corneas clear, EOM's intact, fundi    benign  Ears:    Normal TM's and external ear canals  Nose:   Nares normal, mucosa normal, no drainage or sinus   tenderness  Throat:   Lips, mucosa, and tongue normal; teeth and gums normal  Neck:   Supple, no lymphadenopathy;  thyroid:  no   enlargement/tenderness/nodules; no carotid   bruit or JVD     Lungs:     Clear to auscultation bilaterally without wheezes, rales or     ronchi; respirations unlabored      Heart:    Regular rate and rhythm, S1 and S2 normal, no  murmur, rub   or gallop     Abdomen:     Soft, non-tender, nondistended, normoactive bowel sounds,    no masses, no hepatosplenomegaly  Genitalia:    Normal male external genitalia without lesions.  Testicles without masses.  No inguinal hernias.  Rectal:   Deferred.  Guaiac cards given..  Extremities:   No clubbing, cyanosis or edema  Pulses:   2+ and symmetric all extremities  Skin:   Skin color, texture, turgor normal, no rashes or lesions  Lymph nodes:   Cervical, supraclavicular, and axillary nodes normal  Neurologic:   CNII-XII intact, normal strength, sensation and gait; reflexes 2+ and symmetric throughout          Psych:   Normal mood, affect, hygiene and grooming.  Exam of his foot does show slight irregularity of the plantar fascia.         Assessment & Plan:  Routine general medical examination at a health care facility - Plan: POCT Urinalysis DIP (Proadvantage Device)  Exercise-induced asthma  Hypothyroidism, postablative  Family history of heart disease in male family member before age 50 - Plan: pravastatin (PRAVACHOL) 40 MG tablet  Hypercholesterolemia - Plan: pravastatin (PRAVACHOL) 40 MG tablet I will increase his  Pravachol to work on getting his LDL a little bit lower especially with his family history. Reinforced the need for him to use the albuterol before his runs.  Also definitely make sure he uses it before the 5K and keep me informed as to how his time goes.  Hopefully his times will not get worse but if they do I will discuss this further with cardiology.   Exam of his foot shows no problem.  If this gets worse, further evaluation and treatment may be needed.

## 2018-09-26 DIAGNOSIS — C44311 Basal cell carcinoma of skin of nose: Secondary | ICD-10-CM | POA: Diagnosis not present

## 2018-09-26 DIAGNOSIS — D1801 Hemangioma of skin and subcutaneous tissue: Secondary | ICD-10-CM | POA: Diagnosis not present

## 2018-09-26 DIAGNOSIS — D225 Melanocytic nevi of trunk: Secondary | ICD-10-CM | POA: Diagnosis not present

## 2018-09-26 DIAGNOSIS — D485 Neoplasm of uncertain behavior of skin: Secondary | ICD-10-CM | POA: Diagnosis not present

## 2018-09-26 DIAGNOSIS — L814 Other melanin hyperpigmentation: Secondary | ICD-10-CM | POA: Diagnosis not present

## 2018-10-31 DIAGNOSIS — C44311 Basal cell carcinoma of skin of nose: Secondary | ICD-10-CM | POA: Diagnosis not present

## 2018-11-28 ENCOUNTER — Telehealth: Payer: Self-pay | Admitting: Family Medicine

## 2018-11-28 DIAGNOSIS — E890 Postprocedural hypothyroidism: Secondary | ICD-10-CM

## 2018-11-28 NOTE — Telephone Encounter (Signed)
Pt [facility]

## 2018-11-29 MED ORDER — LEVOTHYROXINE SODIUM 175 MCG PO TABS: 175.0000 ug | ORAL_TABLET | Freq: Every day | ORAL | 2 refills | Status: DC

## 2019-01-30 DIAGNOSIS — H47211 Primary optic atrophy, right eye: Secondary | ICD-10-CM | POA: Diagnosis not present

## 2019-02-21 DIAGNOSIS — H47211 Primary optic atrophy, right eye: Secondary | ICD-10-CM | POA: Diagnosis not present

## 2019-02-22 ENCOUNTER — Other Ambulatory Visit: Payer: Self-pay | Admitting: Optometry

## 2019-02-22 DIAGNOSIS — H5461 Unqualified visual loss, right eye, normal vision left eye: Secondary | ICD-10-CM

## 2019-02-28 ENCOUNTER — Other Ambulatory Visit: Payer: Self-pay

## 2019-02-28 ENCOUNTER — Ambulatory Visit
Admission: RE | Admit: 2019-02-28 | Discharge: 2019-02-28 | Disposition: A | Discharge status: Home / Self Care | Payer: 59 | Source: Ambulatory Visit | Attending: Optometry | Admitting: Optometry

## 2019-02-28 DIAGNOSIS — H5461 Unqualified visual loss, right eye, normal vision left eye: Secondary | ICD-10-CM

## 2019-02-28 MED ORDER — GADOBENATE DIMEGLUMINE 529 MG/ML IV SOLN
15.0000 mL | Freq: Once | INTRAVENOUS | Status: AC | PRN
  Administered 2019-02-28: 15 mL via INTRAVENOUS

## 2019-08-11 ENCOUNTER — Other Ambulatory Visit: Payer: Self-pay | Admitting: Family Medicine

## 2019-08-21 ENCOUNTER — Telehealth: Payer: Self-pay | Admitting: Family Medicine

## 2019-08-21 DIAGNOSIS — J45990 Exercise induced bronchospasm: Secondary | ICD-10-CM

## 2019-08-21 MED ORDER — ALBUTEROL SULFATE HFA 108 (90 BASE) MCG/ACT IN AERS: INHALATION_SPRAY | RESPIRATORY_TRACT | 1 refills | Status: DC

## 2019-08-21 NOTE — Telephone Encounter (Signed)
Fax refill request for albuterol inhaler  States pt needs new rx on file

## 2019-08-24 HISTORY — PX: SKIN BIOPSY: SHX1

## 2019-08-31 ENCOUNTER — Other Ambulatory Visit: Payer: Self-pay | Admitting: Family Medicine

## 2019-08-31 DIAGNOSIS — E890 Postprocedural hypothyroidism: Secondary | ICD-10-CM

## 2019-09-26 ENCOUNTER — Other Ambulatory Visit: Payer: Self-pay | Admitting: Orthopedic Surgery

## 2019-09-26 DIAGNOSIS — R52 Pain, unspecified: Secondary | ICD-10-CM

## 2019-09-26 DIAGNOSIS — R531 Weakness: Secondary | ICD-10-CM

## 2019-10-04 ENCOUNTER — Other Ambulatory Visit: Payer: Self-pay

## 2019-10-04 ENCOUNTER — Ambulatory Visit: Payer: 59 | Admitting: Family Medicine

## 2019-10-04 ENCOUNTER — Encounter: Payer: Self-pay | Admitting: Family Medicine

## 2019-10-04 VITALS — BP 120/82 | HR 67 | Temp 97.8°F | Ht 67.75 in | Wt 171.6 lb

## 2019-10-04 DIAGNOSIS — E890 Postprocedural hypothyroidism: Secondary | ICD-10-CM

## 2019-10-04 DIAGNOSIS — J301 Allergic rhinitis due to pollen: Secondary | ICD-10-CM

## 2019-10-04 DIAGNOSIS — E7800 Pure hypercholesterolemia, unspecified: Secondary | ICD-10-CM

## 2019-10-04 DIAGNOSIS — Z0000 Encounter for general adult medical examination without abnormal findings: Secondary | ICD-10-CM

## 2019-10-04 DIAGNOSIS — Z85828 Personal history of other malignant neoplasm of skin: Secondary | ICD-10-CM

## 2019-10-04 DIAGNOSIS — Z8639 Personal history of other endocrine, nutritional and metabolic disease: Secondary | ICD-10-CM

## 2019-10-04 DIAGNOSIS — G43109 Migraine with aura, not intractable, without status migrainosus: Secondary | ICD-10-CM

## 2019-10-04 DIAGNOSIS — J45990 Exercise induced bronchospasm: Secondary | ICD-10-CM | POA: Diagnosis not present

## 2019-10-04 DIAGNOSIS — Z8249 Family history of ischemic heart disease and other diseases of the circulatory system: Secondary | ICD-10-CM | POA: Diagnosis not present

## 2019-10-04 LAB — POCT URINALYSIS DIP (PROADVANTAGE DEVICE)
Blood, UA: NEGATIVE
Glucose, UA: NEGATIVE mg/dL
Ketones, POC UA: NEGATIVE mg/dL
Leukocytes, UA: NEGATIVE
Nitrite, UA: NEGATIVE
Protein Ur, POC: NEGATIVE mg/dL
Specific Gravity, Urine: 1.02
Urobilinogen, Ur: 0.2
pH, UA: 6 (ref 5.0–8.0)

## 2019-10-04 MED ORDER — ALBUTEROL SULFATE HFA 108 (90 BASE) MCG/ACT IN AERS: INHALATION_SPRAY | RESPIRATORY_TRACT | 1 refills | Status: DC

## 2019-10-04 MED ORDER — LEVOTHYROXINE SODIUM 175 MCG PO TABS: 175.0000 ug | ORAL_TABLET | Freq: Every day | ORAL | 3 refills | Status: DC

## 2019-10-04 MED ORDER — PRAVASTATIN SODIUM 20 MG PO TABS: 20.0000 mg | ORAL_TABLET | Freq: Every day | ORAL | 3 refills | Status: DC

## 2019-10-04 NOTE — Progress Notes (Addendum)
   Subjective:    Patient ID: Shawn Cook, male    DOB: Apr 01, 1971, 48 y.o.   MRN: [id number]  HPI He is here for complete examination.  His diet and exercise pattern has been affected by Covid and he has gained some weight.  He plans to get back and exercising again.  He does have underlying allergies and uses Flonase as well as an antihistamine with good results.  He also has exercise-induced asthma and does use albuterol for that.  Continues on his thyroid medication.  He is also taking 20 mg of pravastatin for his lipids.  He does have a history of ocular migraines but has not had any of these in quite some time.  He has had a BCE surgery on several lesions on his face.  He has no other concerns or complaints.  Family and social history as well as health maintenance and immunizations was reviewed   Review of Systems  All other systems reviewed and are negative.      Objective:   Physical Exam Alert and in no distress.  Healing scar noted on right side of face from recent Mohs surgery .tympanic membranes and canals are normal. Pharyngeal area is normal. Neck is supple without adenopathy or thyromegaly. Cardiac exam shows a regular sinus rhythm without murmurs or gallops. Lungs are clear to auscultation. Abdominal exam shows no masses or tenderness with normal bowel sounds.  Genitalia normal circumcised male.  Testes normal.       Assessment & Plan:  Routine general medical examination at a health care facility - Plan: CBC with Differential, Comprehensive metabolic panel, Lipid panel, POCT Urinalysis DIP (Proadvantage Device)  Exercise-induced asthma - Plan: albuterol (VENTOLIN HFA) 108 (90 Base) MCG/ACT inhaler  Hypothyroidism, postablative - Plan: TSH, levothyroxine (SYNTHROID) 175 MCG tablet  Family history of heart disease in male family member before age 11 - Plan: CBC with Differential, Comprehensive metabolic panel, Lipid panel  Hypercholesterolemia - Plan: Lipid  panel, pravastatin (PRAVACHOL) 20 MG tablet  History of basal cell carcinoma  History of Graves' disease  Seasonal allergic rhinitis due to pollen  Ocular migraine I encouraged him to get back into his regular exercise pattern and continue on present medications.  Medications were renewed.  I then discussed treatment of the ocular migraine.  He rarely has this but did recommend using an NSAID at the first sign of an aura. 11/19 I would like to see his LDL lower and I will therefore increase the Pravachol.

## 2019-10-05 ENCOUNTER — Ambulatory Visit
Admission: RE | Admit: 2019-10-05 | Discharge: 2019-10-05 | Disposition: A | Discharge status: Home / Self Care | Payer: 59 | Source: Ambulatory Visit | Attending: Orthopedic Surgery | Admitting: Orthopedic Surgery

## 2019-10-05 DIAGNOSIS — R531 Weakness: Secondary | ICD-10-CM

## 2019-10-05 DIAGNOSIS — R52 Pain, unspecified: Secondary | ICD-10-CM

## 2019-10-05 LAB — COMPREHENSIVE METABOLIC PANEL
ALT: 31 IU/L (ref 0–44)
AST: 27 IU/L (ref 0–40)
Albumin/Globulin Ratio: 2 (ref 1.2–2.2)
Albumin: 4.6 g/dL (ref 4.0–5.0)
Alkaline Phosphatase: 64 IU/L (ref 39–117)
BUN/Creatinine Ratio: 17 (ref 9–20)
BUN: 19 mg/dL (ref 6–24)
Bilirubin Total: 0.5 mg/dL (ref 0.0–1.2)
CO2: 24 mmol/L (ref 20–29)
Calcium: 9.6 mg/dL (ref 8.7–10.2)
Chloride: 100 mmol/L (ref 96–106)
Creatinine, Ser: 1.15 mg/dL (ref 0.76–1.27)
GFR calc Af Amer: 87 mL/min/{1.73_m2} (ref >59)
GFR calc non Af Amer: 75 mL/min/{1.73_m2} (ref >59)
Globulin, Total: 2.3 g/dL (ref 1.5–4.5)
Glucose: 98 mg/dL (ref 65–99)
Potassium: 4.4 mmol/L (ref 3.5–5.2)
Sodium: 139 mmol/L (ref 134–144)
Total Protein: 6.9 g/dL (ref 6.0–8.5)

## 2019-10-05 LAB — CBC WITH DIFFERENTIAL/PLATELET
Basophils Absolute: 0 10*3/uL (ref 0.0–0.2)
Basos: 1 %
EOS (ABSOLUTE): 0.2 10*3/uL (ref 0.0–0.4)
Eos: 5 %
Hematocrit: 44.3 % (ref 37.5–51.0)
Hemoglobin: 15.2 g/dL (ref 13.0–17.7)
Immature Grans (Abs): 0 10*3/uL (ref 0.0–0.1)
Immature Granulocytes: 0 %
Lymphocytes Absolute: 1.2 10*3/uL (ref 0.7–3.1)
Lymphs: 32 %
MCH: 32 pg (ref 26.6–33.0)
MCHC: 34.3 g/dL (ref 31.5–35.7)
MCV: 93 fL (ref 79–97)
Monocytes Absolute: 0.4 10*3/uL (ref 0.1–0.9)
Monocytes: 11 %
Neutrophils Absolute: 1.8 10*3/uL (ref 1.4–7.0)
Neutrophils: 51 %
Platelets: 152 10*3/uL (ref 150–450)
RBC: 4.75 x10E6/uL (ref 4.14–5.80)
RDW: 12.9 % (ref 11.6–15.4)
WBC: 3.6 10*3/uL (ref 3.4–10.8)

## 2019-10-05 LAB — LIPID PANEL
Chol/HDL Ratio: 3.8 ratio (ref 0.0–5.0)
Cholesterol, Total: 189 mg/dL (ref 100–199)
HDL: 50 mg/dL (ref >39)
LDL Chol Calc (NIH): 113 mg/dL — ABNORMAL HIGH (ref 0–99)
Triglycerides: 145 mg/dL (ref 0–149)
VLDL Cholesterol Cal: 26 mg/dL (ref 5–40)

## 2019-10-05 LAB — TSH: TSH: 0.683 u[IU]/mL (ref 0.450–4.500)

## 2019-10-05 MED ORDER — PRAVASTATIN SODIUM 40 MG PO TABS: 40.0000 mg | ORAL_TABLET | Freq: Every day | ORAL | 3 refills | Status: DC

## 2019-10-05 NOTE — Addendum Note (Signed)
Addended by: Denita Lung on: 10/05/2019 08:25 AM   Modules accepted: Orders

## 2019-10-11 ENCOUNTER — Other Ambulatory Visit: Payer: 59

## 2020-02-06 ENCOUNTER — Encounter: Payer: Self-pay | Admitting: Family Medicine

## 2020-07-04 ENCOUNTER — Other Ambulatory Visit: Payer: Self-pay | Admitting: Family Medicine

## 2020-09-03 ENCOUNTER — Other Ambulatory Visit: Payer: Self-pay | Admitting: Family Medicine

## 2020-09-03 DIAGNOSIS — E890 Postprocedural hypothyroidism: Secondary | ICD-10-CM

## 2020-09-24 ENCOUNTER — Other Ambulatory Visit: Payer: Self-pay | Admitting: Family Medicine

## 2020-10-07 ENCOUNTER — Ambulatory Visit (INDEPENDENT_AMBULATORY_CARE_PROVIDER_SITE_OTHER): Payer: 59 | Admitting: Family Medicine

## 2020-10-07 ENCOUNTER — Encounter: Payer: Self-pay | Admitting: Family Medicine

## 2020-10-07 ENCOUNTER — Other Ambulatory Visit: Payer: Self-pay

## 2020-10-07 VITALS — BP 110/78 | HR 61 | Temp 96.0°F | Ht 67.75 in | Wt 167.8 lb

## 2020-10-07 DIAGNOSIS — E7800 Pure hypercholesterolemia, unspecified: Secondary | ICD-10-CM | POA: Diagnosis not present

## 2020-10-07 DIAGNOSIS — Z8249 Family history of ischemic heart disease and other diseases of the circulatory system: Secondary | ICD-10-CM

## 2020-10-07 DIAGNOSIS — Z23 Encounter for immunization: Secondary | ICD-10-CM | POA: Diagnosis not present

## 2020-10-07 DIAGNOSIS — J45990 Exercise induced bronchospasm: Secondary | ICD-10-CM

## 2020-10-07 DIAGNOSIS — Z0000 Encounter for general adult medical examination without abnormal findings: Secondary | ICD-10-CM | POA: Diagnosis not present

## 2020-10-07 DIAGNOSIS — E890 Postprocedural hypothyroidism: Secondary | ICD-10-CM

## 2020-10-07 DIAGNOSIS — Z1211 Encounter for screening for malignant neoplasm of colon: Secondary | ICD-10-CM

## 2020-10-07 DIAGNOSIS — J301 Allergic rhinitis due to pollen: Secondary | ICD-10-CM

## 2020-10-07 LAB — COMPREHENSIVE METABOLIC PANEL

## 2020-10-07 LAB — CBC WITH DIFFERENTIAL/PLATELET: Immature Grans (Abs): 0 10*3/uL (ref 0.0–0.1)

## 2020-10-07 MED ORDER — LEVOTHYROXINE SODIUM 175 MCG PO TABS: 175.0000 ug | ORAL_TABLET | Freq: Every day | ORAL | 3 refills | Status: DC

## 2020-10-07 MED ORDER — PRAVASTATIN SODIUM 40 MG PO TABS: 40.0000 mg | ORAL_TABLET | Freq: Every day | ORAL | 3 refills | Status: DC

## 2020-10-07 NOTE — Progress Notes (Addendum)
   Subjective:    Patient ID: Shawn Cook, male    DOB: 1971/07/05, 49 y.o.   MRN: [id number]  HPI He is here for complete examination.  He has no particular concerns or complaints.  He is started into an exercise program.  His work and home life are going quite well.  His medications were reviewed and he has no particular concerns or questions about them.  He has underlying allergies but they are not causing any immediate difficulty.  Family and social history as well as health maintenance and immunizations was reviewed   Review of Systems  All other systems reviewed and are negative.      Objective:   Physical Exam Alert and in no distress. Tympanic membranes and canals are normal. Pharyngeal area is normal. Neck is supple without adenopathy or thyromegaly. Cardiac exam shows a regular sinus rhythm without murmurs or gallops. Lungs are clear to auscultation.  Abdominal exam shows no masses or tenderness with normal bowel sounds.       Assessment & Plan:  Routine general medical examination at a health care facility - Plan: CBC with Differential/Platelet, Comprehensive metabolic panel, Lipid panel  Exercise-induced asthma  Hypothyroidism, postablative - Plan: TSH, levothyroxine (SYNTHROID) 175 MCG tablet  Family history of heart disease in male family member before age 47 - Plan: Lipid panel  Hypercholesterolemia - Plan: Lipid panel  Seasonal allergic rhinitis due to pollen  Screening for colon cancer - Plan: Cologuard  Immunization, viral disease - Plan: Moderna SARS-CoV-2 Vaccine  He uses an inhaler as needed for his exercise-induced asthma.  Continues on his thyroid medication.  He will continue on Pravachol. I discussed the issue of colon cancer screening and will go ahead and order the Cologuard.  There is no family history of colon cancer.  11/23 lipids are not at goal.  I will switch him to Lipitor.

## 2020-10-08 LAB — LIPID PANEL
Chol/HDL Ratio: 3.7 ratio (ref 0.0–5.0)
Cholesterol, Total: 199 mg/dL (ref 100–199)
HDL: 54 mg/dL (ref >39)
LDL Chol Calc (NIH): 127 mg/dL — ABNORMAL HIGH (ref 0–99)
Triglycerides: 100 mg/dL (ref 0–149)
VLDL Cholesterol Cal: 18 mg/dL (ref 5–40)

## 2020-10-08 LAB — CBC WITH DIFFERENTIAL/PLATELET
Basophils Absolute: 0 10*3/uL (ref 0.0–0.2)
Basos: 1 %
EOS (ABSOLUTE): 0.2 10*3/uL (ref 0.0–0.4)
Eos: 6 %
Hematocrit: 43.6 % (ref 37.5–51.0)
Hemoglobin: 15.4 g/dL (ref 13.0–17.7)
Immature Granulocytes: 0 %
Lymphocytes Absolute: 1.3 10*3/uL (ref 0.7–3.1)
Lymphs: 33 %
MCH: 33.1 pg — ABNORMAL HIGH (ref 26.6–33.0)
MCHC: 35.3 g/dL (ref 31.5–35.7)
MCV: 94 fL (ref 79–97)
Monocytes Absolute: 0.5 10*3/uL (ref 0.1–0.9)
Monocytes: 12 %
Neutrophils Absolute: 2 10*3/uL (ref 1.4–7.0)
Neutrophils: 48 %
Platelets: 161 10*3/uL (ref 150–450)
RBC: 4.65 x10E6/uL (ref 4.14–5.80)
RDW: 12.1 % (ref 11.6–15.4)
WBC: 4 10*3/uL (ref 3.4–10.8)

## 2020-10-08 LAB — COMPREHENSIVE METABOLIC PANEL
AST: 37 IU/L (ref 0–40)
Albumin/Globulin Ratio: 2 (ref 1.2–2.2)
Albumin: 4.7 g/dL (ref 4.0–5.0)
Alkaline Phosphatase: 73 IU/L (ref 44–121)
BUN/Creatinine Ratio: 10 (ref 9–20)
BUN: 13 mg/dL (ref 6–24)
Bilirubin Total: 0.8 mg/dL (ref 0.0–1.2)
CO2: 26 mmol/L (ref 20–29)
Calcium: 9.7 mg/dL (ref 8.7–10.2)
Chloride: 100 mmol/L (ref 96–106)
Creatinine, Ser: 1.28 mg/dL — ABNORMAL HIGH (ref 0.76–1.27)
GFR calc Af Amer: 76 mL/min/{1.73_m2} (ref >59)
GFR calc non Af Amer: 66 mL/min/{1.73_m2} (ref >59)
Globulin, Total: 2.3 g/dL (ref 1.5–4.5)
Glucose: 95 mg/dL (ref 65–99)
Potassium: 4.8 mmol/L (ref 3.5–5.2)
Sodium: 139 mmol/L (ref 134–144)
Total Protein: 7 g/dL (ref 6.0–8.5)

## 2020-10-08 LAB — TSH: TSH: 0.469 u[IU]/mL (ref 0.450–4.500)

## 2020-10-08 MED ORDER — ATORVASTATIN CALCIUM 40 MG PO TABS: 40.0000 mg | ORAL_TABLET | Freq: Every day | ORAL | 3 refills | Status: DC

## 2020-10-08 NOTE — Addendum Note (Signed)
Addended by: Denita Lung on: 10/08/2020 08:13 AM   Modules accepted: Orders

## 2020-10-16 LAB — COLOGUARD: Cologuard: NEGATIVE

## 2020-10-24 LAB — COLOGUARD: COLOGUARD: NEGATIVE

## 2020-11-01 NOTE — Progress Notes (Signed)
lvm for pt advising of negative cologuard. [place]

## 2021-03-04 ENCOUNTER — Telehealth: Payer: Self-pay

## 2021-03-04 MED ORDER — VALACYCLOVIR HCL 1 G PO TABS: 1000.0000 mg | ORAL_TABLET | Freq: Every day | ORAL | 3 refills | Status: DC

## 2021-03-04 NOTE — Telephone Encounter (Signed)
Pt. Called LM for a [facility] [address]. Pt. Last apt was 10/07/20 and next apt is 10/16/21.

## 2021-07-14 ENCOUNTER — Ambulatory Visit: Payer: 59 | Admitting: Medical

## 2021-07-14 ENCOUNTER — Other Ambulatory Visit: Payer: Self-pay

## 2021-07-14 VITALS — BP 120/70 | HR 65 | Temp 97.4°F | Wt 173.0 lb

## 2021-07-14 DIAGNOSIS — H01002 Unspecified blepharitis right lower eyelid: Secondary | ICD-10-CM

## 2021-07-14 DIAGNOSIS — H00012 Hordeolum externum right lower eyelid: Secondary | ICD-10-CM | POA: Diagnosis not present

## 2021-07-14 MED ORDER — ERYTHROMYCIN 5 MG/GM OP OINT: 1.0000 "application " | TOPICAL_OINTMENT | Freq: Every day | OPHTHALMIC | 0 refills | Status: DC

## 2021-07-14 NOTE — Progress Notes (Signed)
Subjective:  Shawn Cook is a 50 y.o. male who presents for Chief Complaint  Patient presents with   Stye    Stye on eye. Since last Thursday. First time ever getting one     Here for stye.  No prior similar.   Since last week started having area of fullness on right lower eye lid.   Over last few days worse redness, swelling and discomfort.   No fever, no body aches or chills.   No concern for foreign body.  No other aggravating or relieving factors.    No other c/o.  The following portions of the patient's history were reviewed and updated as appropriate: allergies, current medications, past family history, past medical history, past social history, past surgical history and problem list.  ROS Otherwise as in subjective above    Objective: BP 120/70   Pulse 65   Temp (!) 97.4 F (36.3 C)   Wt 173 lb (78.5 kg)   BMI 26.50 kg/m   General appearance: alert, no distress, well developed, well nourished Right lower eyelid midline with soft tissue swelling and erythema and some general pink-red coloration along the majority of the right lower eyelid.  In the midportion of the right lower eyelid is a roundish fullness suggestive of stye.  PERRLA, EOMI    Assessment: Encounter Diagnoses  Name Primary?   Blepharitis of right lower eyelid, unspecified type Yes   Hordeolum externum of right lower eyelid      Plan: He likely start out with a stye but there is the appearance of blepharitis now as well.  Begin E-Mycin below.  Discussed proper use of medication.  Discussed hygiene, continue warm compresses.  Discussed possible risk of complications and symptoms that would suggest the need for urgent recheck.  Call or recheck if worse or not improving over the next few days  Shawn Cook was seen today for stye.  Diagnoses and all orders for this visit:  Blepharitis of right lower eyelid, unspecified type  Hordeolum externum of right lower eyelid  Other orders -      erythromycin ophthalmic ointment; Place 1 application into the right eye at bedtime.   Follow up: prn

## 2021-08-29 IMAGING — MR MR KNEE*L* W/O CM
4 of 7 series · 21 of 40 positions shown · non-contrast
Comparison: None.
COMPARISON: None.

CLINICAL DATA: Knee pain described as mainly medial, with posterior
tension, and recent anterior infrapatellar pain.

EXAM:
MRI OF THE LEFT KNEE WITHOUT CONTRAST
TECHNIQUE: Multiplanar, multisequence MR imaging of the knee was performed. No
intravenous contrast was administered.

[Series 3: T2 fat-sat · axial · 4.0mm · 0.50mm/px · z∈[-66,+44]mm · 6 of 28 slices shown]
[im 1/28]
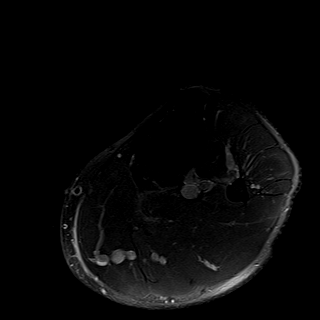
[im 5/28]
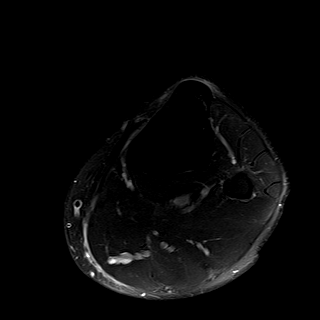
[im 10/28]
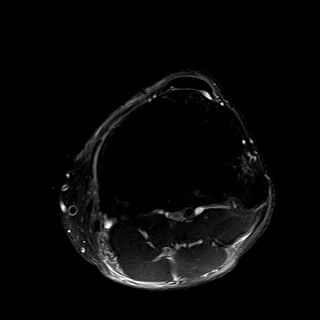
[im 14/28]
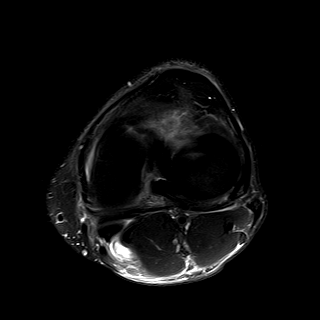
[im 19/28]
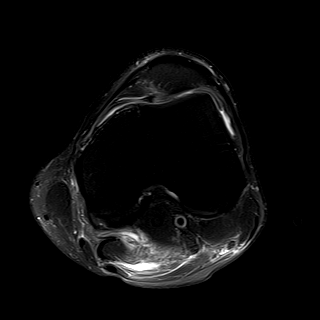
[im 23/28]
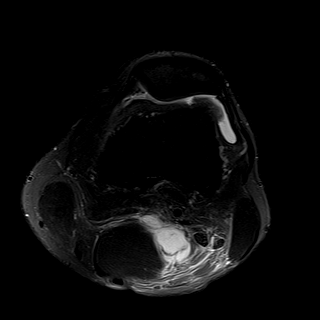

[Series 6: PD fat-sat · coronal · 3.0mm · 0.29mm/px · 7 of 32 slices shown (1 of 3)]
[im 1/32]
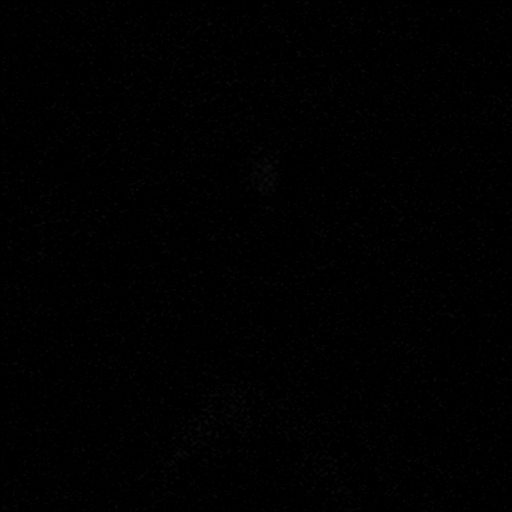
[im 6/32]
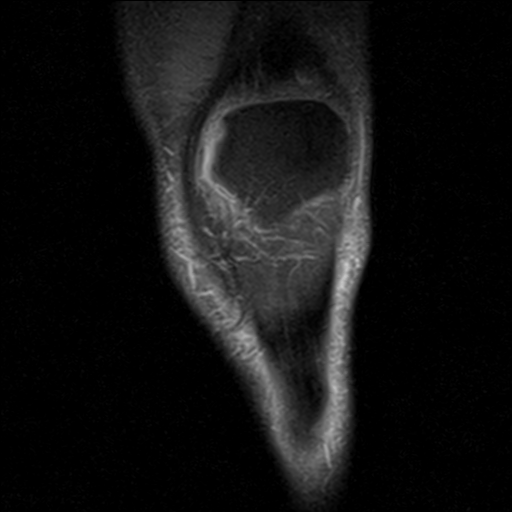
[im 11/32]
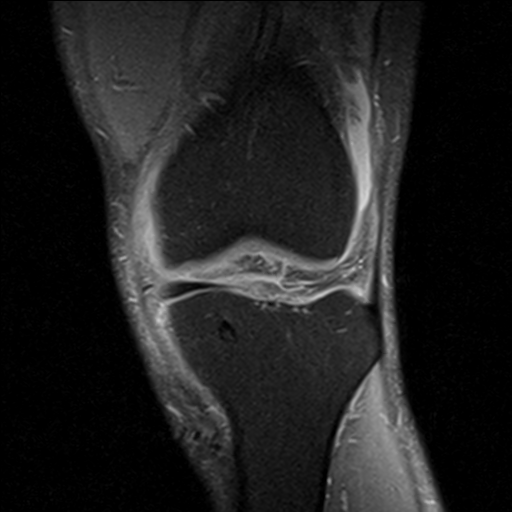
[im 16/32]
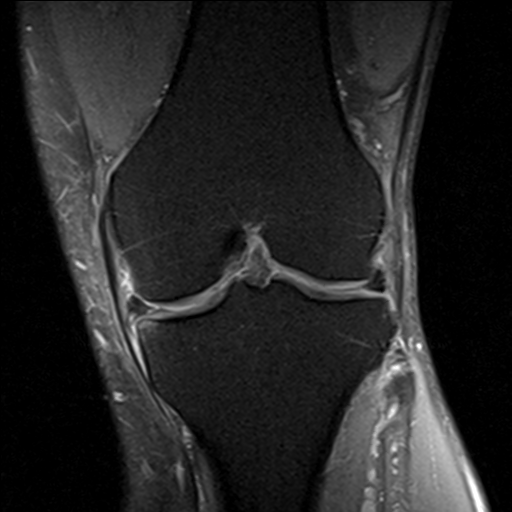
[im 21/32]
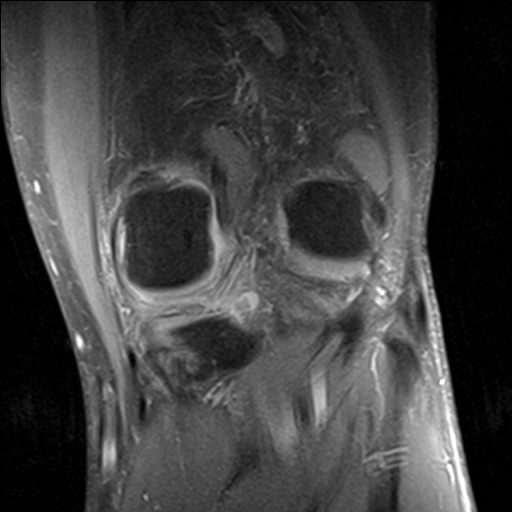
[im 26/32]
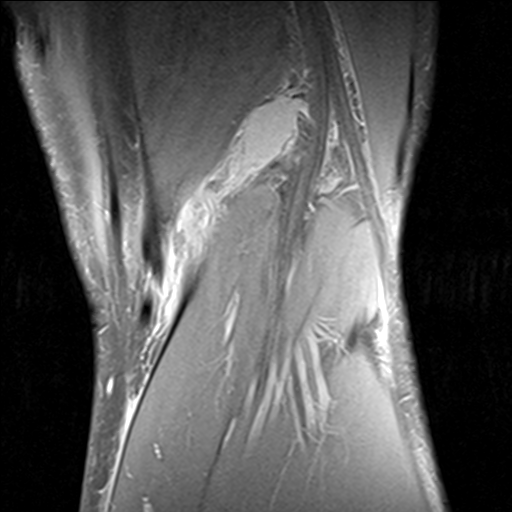
[im 32/32]
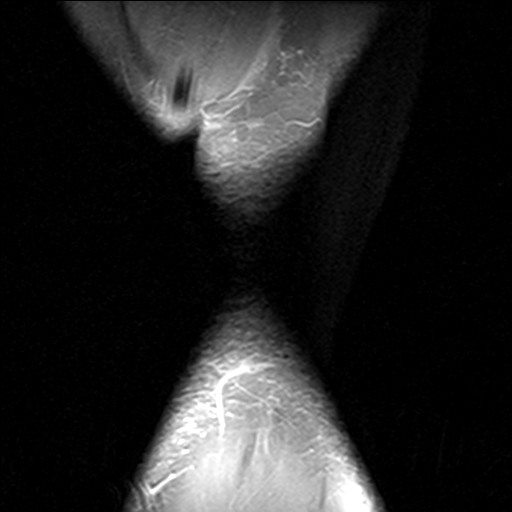

[Series 8: PD fat-sat · sagittal · 3.0mm · 0.29mm/px · 6 of 28 slices shown (2 of 3)]
[im 1/28]
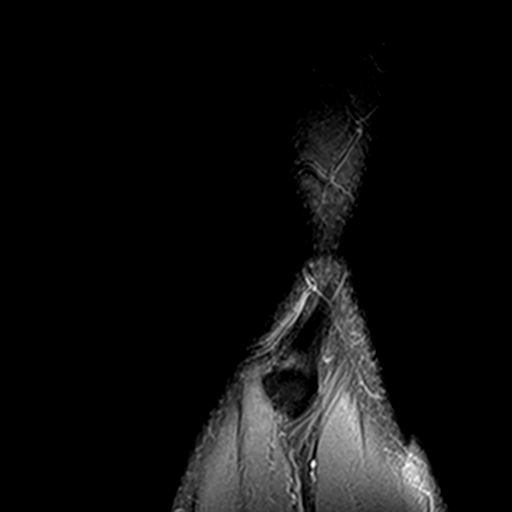
[im 6/28]
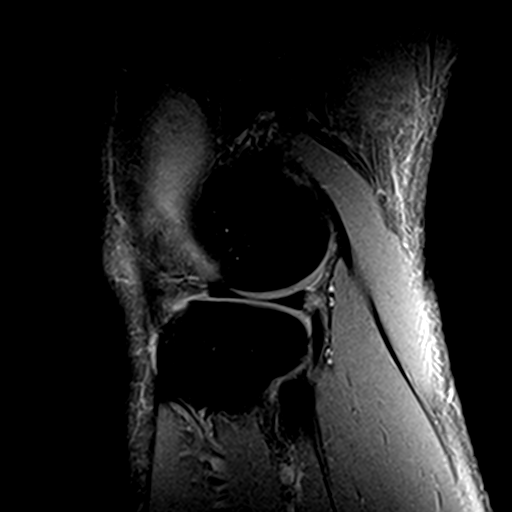
[im 11/28]
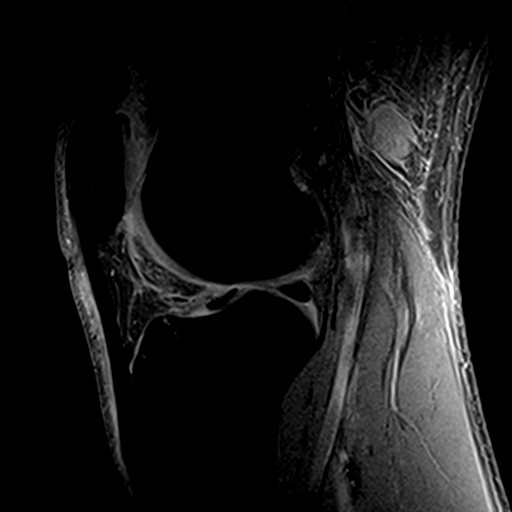
[im 17/28]
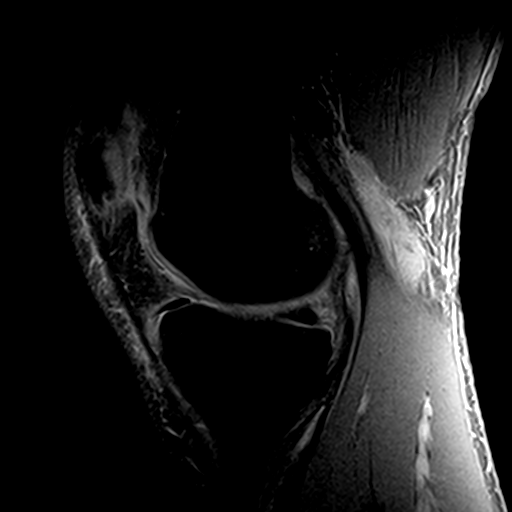
[im 22/28]
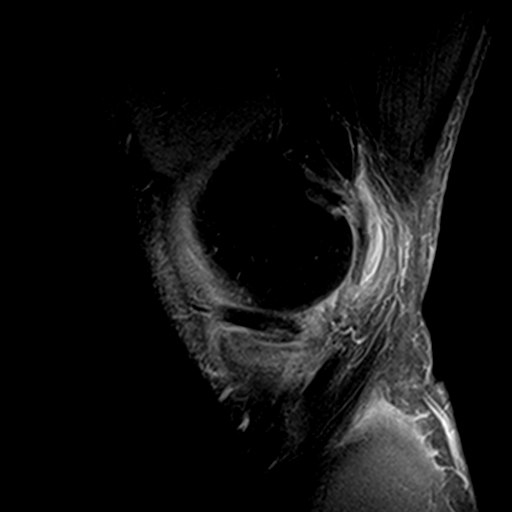
[im 28/28]
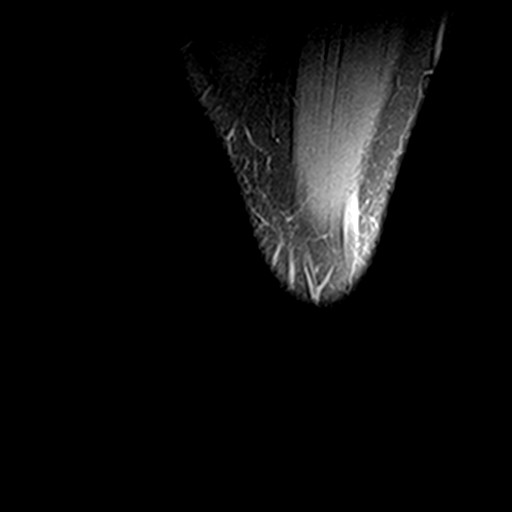

[Series 9: PD fat-sat · oblique · 2.3mm · 0.29mm/px · 2 of 11 slices shown (3 of 3)]
[im 1/11]
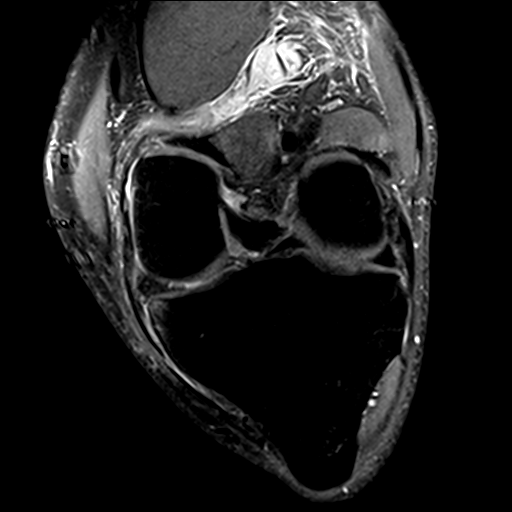
[im 11/11]
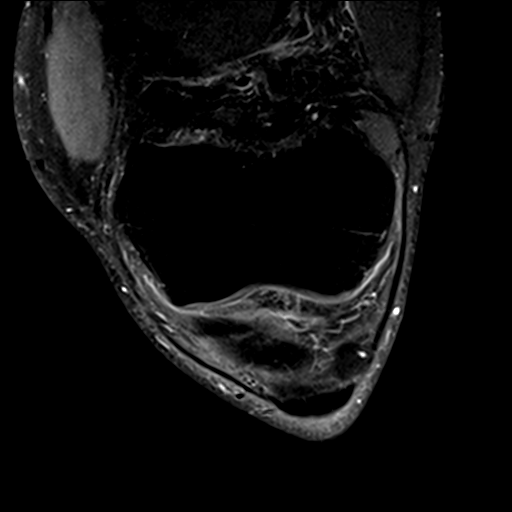

[21 of 40 positions shown; findings below may reference images not displayed]

FINDINGS: MENISCI

Medial meniscus: Complex tear of the posterior horn with an oblique
component involving the inferior surface on image [DATE]; a peripheral
vertical component extending to the superior surface and possibly
the inferior surface also on image [DATE]; and a free edge component
in the midbody on image [DATE]. There may be free edge involvement in
the posterior horn on image [DATE] as well.

Lateral meniscus:  Unremarkable

LIGAMENTS

Cruciates:  Unremarkable

Collaterals:  Unremarkable

CARTILAGE

Patellofemoral:  Unremarkable

Medial: Mild endosteal/subcortical marrow edema along the rim of the
medial tibial plateau.

Lateral:  Unremarkable

Joint: Small knee effusion. Thin medial plica. Edema tracks along
the mildly thickened infrapatellar plica may be mildly sprained.

Popliteal Fossa: Partially ruptured moderate-sized Baker's cyst.
Adjacent edema tracking along popliteal tissue planes and in the
subcutaneous tissues along the superficial fascia margin. Distal
semimembranosus tendinopathy noted.

Extensor Mechanism: Tibial tubercle-trochlear groove distance
cm.

Bones: No significant extra-articular osseous abnormalities
identified.

Other: No supplemental non-categorized findings.
FINDINGS: MENISCI

Medial meniscus: Complex tear of the posterior horn with an oblique
component involving the inferior surface on image [DATE]; a peripheral
vertical component extending to the superior surface and possibly
the inferior surface also on image [DATE]; and a free edge component
in the midbody on image [DATE]. There is likely free edge involvement
in the posterior horn on image [DATE] as well.

Lateral meniscus:  Unremarkable

LIGAMENTS

Cruciates:  Unremarkable

Collaterals:  Unremarkable

CARTILAGE

Patellofemoral:  Unremarkable

Medial: Mild endosteal/subcortical marrow edema along the rim of the
medial tibial plateau.

Lateral:  Unremarkable

Joint: Small knee effusion. Thin medial plica. Edema tracks along
the mildly thickened infrapatellar plica may be mildly sprained.

Popliteal Fossa: Partially ruptured moderate-sized Baker's cyst.
Adjacent edema tracking along popliteal tissue planes and in the
subcutaneous tissues along the superficial fascia margin. Distal
semimembranosus tendinopathy noted.

Extensor Mechanism: Tibial tubercle-trochlear groove distance
cm.

Bones: No significant extra-articular osseous abnormalities
identified.

Other: No supplemental non-categorized findings.
IMPRESSION: 1. Complex tear of the posterior horn and midbody medial meniscus.
2. Small knee effusion with partially ruptured moderate-sized
Baker's cyst.
3. Distal semimembranosus tendinopathy.
4. Mild endosteal/subcortical marrow edema along the rim of the
medial tibial plateau.
5. Edema tracks along the mildly thickened infrapatellar plica may
be mildly sprained. Thin medial plica.

## 2021-09-30 ENCOUNTER — Other Ambulatory Visit: Payer: Self-pay | Admitting: Family Medicine

## 2021-09-30 DIAGNOSIS — E890 Postprocedural hypothyroidism: Secondary | ICD-10-CM

## 2021-10-16 ENCOUNTER — Ambulatory Visit (INDEPENDENT_AMBULATORY_CARE_PROVIDER_SITE_OTHER): Payer: 59 | Admitting: Family Medicine

## 2021-10-16 ENCOUNTER — Other Ambulatory Visit: Payer: Self-pay

## 2021-10-16 ENCOUNTER — Encounter: Payer: Self-pay | Admitting: Family Medicine

## 2021-10-16 VITALS — BP 112/74 | HR 61 | Temp 96.3°F | Ht 68.0 in | Wt 173.6 lb

## 2021-10-16 DIAGNOSIS — E890 Postprocedural hypothyroidism: Secondary | ICD-10-CM | POA: Diagnosis not present

## 2021-10-16 DIAGNOSIS — Z0000 Encounter for general adult medical examination without abnormal findings: Secondary | ICD-10-CM | POA: Diagnosis not present

## 2021-10-16 DIAGNOSIS — Z8249 Family history of ischemic heart disease and other diseases of the circulatory system: Secondary | ICD-10-CM

## 2021-10-16 DIAGNOSIS — G43109 Migraine with aura, not intractable, without status migrainosus: Secondary | ICD-10-CM | POA: Diagnosis not present

## 2021-10-16 DIAGNOSIS — J45990 Exercise induced bronchospasm: Secondary | ICD-10-CM

## 2021-10-16 DIAGNOSIS — Z8619 Personal history of other infectious and parasitic diseases: Secondary | ICD-10-CM

## 2021-10-16 DIAGNOSIS — J301 Allergic rhinitis due to pollen: Secondary | ICD-10-CM | POA: Diagnosis not present

## 2021-10-16 DIAGNOSIS — E7800 Pure hypercholesterolemia, unspecified: Secondary | ICD-10-CM

## 2021-10-16 MED ORDER — LEVOTHYROXINE SODIUM 175 MCG PO TABS: 175.0000 ug | ORAL_TABLET | Freq: Every day | ORAL | 3 refills | Status: DC

## 2021-10-16 MED ORDER — ATORVASTATIN CALCIUM 40 MG PO TABS: 40.0000 mg | ORAL_TABLET | Freq: Every day | ORAL | 3 refills | Status: DC

## 2021-10-16 MED ORDER — ALBUTEROL SULFATE HFA 108 (90 BASE) MCG/ACT IN AERS: INHALATION_SPRAY | RESPIRATORY_TRACT | 1 refills | Status: DC

## 2021-10-16 NOTE — Progress Notes (Signed)
   Subjective:    Patient ID: Shawn Cook, male    DOB: 27-Sep-1971, 50 y.o.   MRN: [id number]  HPI He is here for complete examination.  He does complain of some slight fatigue but has restarted back into his regular exercise program.  Continues on his thyroid medicine and is having no difficulty with that.  He is also taking Lipitor regularly.  Does use Ventolin on an as-needed basis for exercise-induced asthma.  Has not had the need for Valtrex in quite some time and has not had an ocular migraine in well over a year.  His work and home life are going quite well.   Review of Systems  All other systems reviewed and are negative.     Objective:   Physical Exam Alert and in no distress.  DTRs are normal tympanic membranes and canals are normal. Pharyngeal area is normal. Neck is supple without adenopathy or thyromegaly. Cardiac exam shows a regular sinus rhythm without murmurs or gallops. Lungs are clear to auscultation.        Assessment & Plan:  Routine general medical examination at a health care facility - Plan: CBC with Differential/Platelet, Comprehensive metabolic panel, Lipid panel  Ocular migraine  Seasonal allergic rhinitis due to pollen  Exercise-induced asthma - Plan: albuterol (VENTOLIN HFA) 108 (90 Base) MCG/ACT inhaler  Hypothyroidism, postablative - Plan: TSH, levothyroxine (SYNTHROID) 175 MCG tablet  Family history of heart disease in male family member before age 43 - Plan: atorvastatin (LIPITOR) 40 MG tablet  History of herpes labialis  Hypercholesterolemia - Plan: Lipid panel, atorvastatin (LIPITOR) 40 MG tablet Continue to treat allergies as needed and use albuterol as needed for exercise-induced asthma.  No therapy at this point for the ocular migraines since he is not having difficulty with that.  He will call when he needs a refill on his Valtrex.

## 2021-10-17 LAB — COMPREHENSIVE METABOLIC PANEL
ALT: 42 IU/L (ref 0–44)
AST: 39 IU/L (ref 0–40)
Albumin/Globulin Ratio: 2 (ref 1.2–2.2)
Albumin: 4.7 g/dL (ref 4.0–5.0)
Alkaline Phosphatase: 69 IU/L (ref 44–121)
BUN/Creatinine Ratio: 17 (ref 9–20)
BUN: 20 mg/dL (ref 6–24)
Bilirubin Total: 0.3 mg/dL (ref 0.0–1.2)
CO2: 26 mmol/L (ref 20–29)
Calcium: 9.9 mg/dL (ref 8.7–10.2)
Chloride: 99 mmol/L (ref 96–106)
Creatinine, Ser: 1.17 mg/dL (ref 0.76–1.27)
Globulin, Total: 2.4 g/dL (ref 1.5–4.5)
Glucose: 101 mg/dL — ABNORMAL HIGH (ref 70–99)
Potassium: 5 mmol/L (ref 3.5–5.2)
Sodium: 141 mmol/L (ref 134–144)
Total Protein: 7.1 g/dL (ref 6.0–8.5)
eGFR: 76 mL/min/{1.73_m2} (ref >59)

## 2021-10-17 LAB — CBC WITH DIFFERENTIAL/PLATELET
Basophils Absolute: 0 10*3/uL (ref 0.0–0.2)
Basos: 1 %
EOS (ABSOLUTE): 0.3 10*3/uL (ref 0.0–0.4)
Eos: 6 %
Hematocrit: 40.8 % (ref 37.5–51.0)
Hemoglobin: 13.5 g/dL (ref 13.0–17.7)
Immature Grans (Abs): 0 10*3/uL (ref 0.0–0.1)
Immature Granulocytes: 0 %
Lymphocytes Absolute: 1.4 10*3/uL (ref 0.7–3.1)
Lymphs: 35 %
MCH: 29.5 pg (ref 26.6–33.0)
MCHC: 33.1 g/dL (ref 31.5–35.7)
MCV: 89 fL (ref 79–97)
Monocytes Absolute: 0.5 10*3/uL (ref 0.1–0.9)
Monocytes: 12 %
Neutrophils Absolute: 1.9 10*3/uL (ref 1.4–7.0)
Neutrophils: 46 %
Platelets: 178 10*3/uL (ref 150–450)
RBC: 4.57 x10E6/uL (ref 4.14–5.80)
RDW: 12 % (ref 11.6–15.4)
WBC: 4.1 10*3/uL (ref 3.4–10.8)

## 2021-10-17 LAB — TSH: TSH: 0.42 u[IU]/mL — ABNORMAL LOW (ref 0.450–4.500)

## 2021-10-17 LAB — LIPID PANEL
Chol/HDL Ratio: 3.5 ratio (ref 0.0–5.0)
Cholesterol, Total: 197 mg/dL (ref 100–199)
HDL: 57 mg/dL (ref >39)
LDL Chol Calc (NIH): 128 mg/dL — ABNORMAL HIGH (ref 0–99)
Triglycerides: 65 mg/dL (ref 0–149)
VLDL Cholesterol Cal: 12 mg/dL (ref 5–40)

## 2022-07-10 ENCOUNTER — Encounter: Payer: Self-pay | Admitting: Family Medicine

## 2022-07-10 ENCOUNTER — Ambulatory Visit: Payer: 59 | Admitting: Family Medicine

## 2022-07-10 VITALS — BP 122/80 | HR 78 | Temp 98.4°F | Wt 172.4 lb

## 2022-07-10 DIAGNOSIS — I781 Nevus, non-neoplastic: Secondary | ICD-10-CM | POA: Diagnosis not present

## 2022-07-10 NOTE — Progress Notes (Signed)
   Subjective:    Patient ID: Shawn Cook, male    DOB: [birth year], 51 y.o.   MRN: [id number]  HPI He is here for consult concerning a slight change in the vascularity of his nose.  He also had some episodes of feeling of facial warmth especially when he was down in [place].  He was not exposed to anything.  Did not drink any more caffeine or have any other vasodilatation types of medications.  Concerned about possible exposure to some chemicals and other causes.  His alcohol consumption is minimal.  He is concerned that atorvastatin might be causing this.   Review of Systems     Objective:   Physical Exam Exam of the face and nose does show very minimal spider veins present on the nose.  No other lesions are noted.       Assessment & Plan:  Spider veins I explained that the spider veins are really benign in nature and so small that it would be hard to treat.  Discussed possible referral to dermatology however at this point no referral will be made.  Discussed the fact that atorvastatin should not be causing no symptoms.  He was comfortable with that.

## 2022-07-22 ENCOUNTER — Encounter: Payer: Self-pay | Admitting: Internal Medicine

## 2022-08-25 ENCOUNTER — Encounter: Payer: Self-pay | Admitting: Internal Medicine

## 2022-09-07 ENCOUNTER — Encounter: Payer: Self-pay | Admitting: Internal Medicine

## 2022-10-22 ENCOUNTER — Other Ambulatory Visit: Payer: Self-pay | Admitting: Family Medicine

## 2022-10-22 DIAGNOSIS — E7800 Pure hypercholesterolemia, unspecified: Secondary | ICD-10-CM

## 2022-10-22 DIAGNOSIS — Z8249 Family history of ischemic heart disease and other diseases of the circulatory system: Secondary | ICD-10-CM

## 2022-10-22 DIAGNOSIS — E890 Postprocedural hypothyroidism: Secondary | ICD-10-CM

## 2022-11-03 ENCOUNTER — Ambulatory Visit: Payer: 59 | Admitting: Family Medicine

## 2022-11-03 ENCOUNTER — Encounter: Payer: Self-pay | Admitting: Family Medicine

## 2022-11-03 VITALS — BP 124/86 | HR 63 | Ht 67.75 in | Wt 175.8 lb

## 2022-11-03 DIAGNOSIS — J301 Allergic rhinitis due to pollen: Secondary | ICD-10-CM

## 2022-11-03 DIAGNOSIS — G43109 Migraine with aura, not intractable, without status migrainosus: Secondary | ICD-10-CM | POA: Diagnosis not present

## 2022-11-03 DIAGNOSIS — E890 Postprocedural hypothyroidism: Secondary | ICD-10-CM

## 2022-11-03 DIAGNOSIS — E7800 Pure hypercholesterolemia, unspecified: Secondary | ICD-10-CM

## 2022-11-03 DIAGNOSIS — J45990 Exercise induced bronchospasm: Secondary | ICD-10-CM | POA: Diagnosis not present

## 2022-11-03 DIAGNOSIS — Z8639 Personal history of other endocrine, nutritional and metabolic disease: Secondary | ICD-10-CM

## 2022-11-03 DIAGNOSIS — Z0000 Encounter for general adult medical examination without abnormal findings: Secondary | ICD-10-CM

## 2022-11-03 DIAGNOSIS — Z8249 Family history of ischemic heart disease and other diseases of the circulatory system: Secondary | ICD-10-CM

## 2022-11-03 DIAGNOSIS — Z23 Encounter for immunization: Secondary | ICD-10-CM | POA: Diagnosis not present

## 2022-11-03 LAB — POCT URINALYSIS DIP (PROADVANTAGE DEVICE)
Bilirubin, UA: NEGATIVE
Blood, UA: NEGATIVE
Glucose, UA: NEGATIVE mg/dL
Ketones, POC UA: NEGATIVE mg/dL
Leukocytes, UA: NEGATIVE
Nitrite, UA: NEGATIVE
Protein Ur, POC: NEGATIVE mg/dL
Specific Gravity, Urine: 1.005
Urobilinogen, Ur: 0.2
pH, UA: 6.5 (ref 5.0–8.0)

## 2022-11-03 MED ORDER — ATORVASTATIN CALCIUM 40 MG PO TABS: 40.0000 mg | ORAL_TABLET | Freq: Every day | ORAL | 3 refills | Status: DC

## 2022-11-03 MED ORDER — LEVOTHYROXINE SODIUM 175 MCG PO TABS: 175.0000 ug | ORAL_TABLET | Freq: Every day | ORAL | 3 refills | Status: DC

## 2022-11-03 NOTE — Progress Notes (Signed)
Complete physical exam  Patient: Shawn Cook   DOB: 03-22-1971   51 y.o. Male  MRN: [id number]  Subjective:    Chief Complaint  Patient presents with   Annual Exam    Fasting. No additional concerns.     Shawn Cook is a 51 y.o. male who presents today for a complete physical exam. He reports consuming a general diet. Home exercise routine includes weight lifting and jogging. He generally feels fairly well. He reports sleeping fairly well. He does not have additional problems to discuss today.  He continues on Lipitor and is having no difficulty with that.  He is also taking Synthroid regularly.  He uses Ventolin for exercise-induced asthma.  His work and home life are going well.  Family and social history as well as health maintenance and immunizations was reviewed.  Most recent fall risk assessment:    11/03/2022    8:26 AM  [place] in the past year? 0  Number falls in past yr: 0  Injury with Fall? 0  Risk for fall due to : No Fall Risks  Follow up Falls evaluation completed     Most recent depression screenings:    11/03/2022    8:21 AM 10/16/2021    8:29 AM  PHQ 2/9 Scores  PHQ - 2 Score 0 0    Vision:Within last year, Dental: No current dental problems and Receives regular dental care, and PSA:     Patient Care Team: Denita Lung, MD as PCP - General (Family Medicine)   Outpatient Medications Prior to Visit  Medication Sig   albuterol (VENTOLIN HFA) 108 (90 Base) MCG/ACT inhaler USE 2 PUFFS EVERY 6 HOURS  AS NEEDED FOR WHEEZING   Multiple Vitamin (MULTIVITAMIN WITH MINERALS) TABS tablet Take 1 tablet by mouth daily.   valACYclovir (VALTREX) 1000 MG tablet Take 1 tablet (1,000 mg total) by mouth daily.   [DISCONTINUED] atorvastatin (LIPITOR) 40 MG tablet TAKE 1 TABLET BY MOUTH EVERY DAY   [DISCONTINUED] levothyroxine (SYNTHROID) 175 MCG tablet TAKE 1 TABLET BY MOUTH EVERY DAY   [DISCONTINUED] erythromycin ophthalmic ointment  Place 1 application into the right eye at bedtime. (Patient not taking: Reported on 10/16/2021)   No facility-administered medications prior to visit.    Review of Systems  All other systems reviewed and are negative.         Objective:     BP 124/86   Pulse 63   Ht 5' 7.75" (1.721 m)   Wt 175 lb 12.8 oz (79.7 kg)   SpO2 98% Comment: room air  BMI 26.93 kg/m    Physical Exam   Alert and in no distress. Tympanic membranes and canals are normal. Pharyngeal area is normal. Neck is supple without adenopathy or thyromegaly. Cardiac exam shows a regular sinus rhythm without murmurs or gallops. Lungs are clear to auscultation.     Assessment & Plan:    Routine general medical examination at a health care facility - Plan: CBC with Differential/Platelet, Comprehensive metabolic panel, Lipid panel, POCT Urinalysis DIP (Proadvantage Device)  Ocular migraine  Exercise-induced asthma  Seasonal allergic rhinitis due to pollen  Hypothyroidism, postablative - Plan: TSH, levothyroxine (SYNTHROID) 175 MCG tablet  Hypercholesterolemia - Plan: Lipid panel, atorvastatin (LIPITOR) 40 MG tablet  History of Graves' disease - Plan: TSH  Family history of heart disease in male family member before age 21 - Plan: Lipid panel, atorvastatin (LIPITOR) 40 MG tablet  Need for Tdap vaccination -  Plan: Tdap vaccine greater than or equal to 7yo IM  Need for COVID-19 vaccine - Plan: Pfizer Fall 2023 Covid-19 Vaccine 8yr and older  Immunization History  Administered Date(s) Administered   COVID-19, mRNA, vaccine(Comirnaty)12 years and older 11/03/2022   Influenza Split 08/27/2009   Influenza,inj,Quad PF,6+ Mos 12/12/2014, 07/25/2018, 08/16/2022   Influenza-Unspecified 08/27/2016, 09/13/2017, 08/30/2020, 08/20/2021   Moderna Sars-Covid-2 Vaccination 12/18/2019, 01/15/2020, 10/07/2020   Tdap 01/13/2012, 11/03/2022    Health Maintenance  Topic Date Due   HIV Screening  Never done   Zoster  Vaccines- Shingrix (1 of 2) Never done   COVID-19 Vaccine (5 - 2023-24 season) 12/29/2022   Fecal DNA (Cologuard)  10/17/2023   DTaP/Tdap/Td (3 - Td or Tdap) 11/03/2032   INFLUENZA VACCINE  Completed   Hepatitis C Screening  Completed   HPV VACCINES  Aged Out    He takes excellent care of himself and I complemented him on that.  He will continue on his present medication regimen.   Problem List Items Addressed This Visit     Allergic rhinitis due to pollen   Exercise-induced asthma   Family history of heart disease in male family member before age 50  Relevant Medications   atorvastatin (LIPITOR) 40 MG tablet   Other Relevant Orders   Lipid panel   History of Graves' disease   Relevant Orders   TSH   Hypercholesterolemia   Relevant Medications   atorvastatin (LIPITOR) 40 MG tablet   Other Relevant Orders   Lipid panel   Hypothyroidism, postablative   Relevant Medications   levothyroxine (SYNTHROID) 175 MCG tablet   Other Relevant Orders   TSH   Ocular migraine   Relevant Medications   atorvastatin (LIPITOR) 40 MG tablet   Other Visit Diagnoses     Routine general medical examination at a health care facility    -  Primary   Relevant Orders   CBC with Differential/Platelet   Comprehensive metabolic panel   Lipid panel   POCT Urinalysis DIP (Proadvantage Device) (Completed)   Need for Tdap vaccination       Relevant Orders   Tdap vaccine greater than or equal to 7yo IM (Completed)   Need for COVID-19 vaccine       Relevant Orders   Pfizer Fall 2023 Covid-19 Vaccine 175yrand older (Completed)     I encouraged him to continue to take good care of himself. Follow-up 1 year    JoJill AlexandersMD

## 2022-11-04 ENCOUNTER — Encounter: Payer: Self-pay | Admitting: Family Medicine

## 2022-11-04 LAB — CBC WITH DIFFERENTIAL/PLATELET
Basophils Absolute: 0 10*3/uL (ref 0.0–0.2)
Basos: 1 %
EOS (ABSOLUTE): 0.2 10*3/uL (ref 0.0–0.4)
Eos: 6 %
Hematocrit: 40.1 % (ref 37.5–51.0)
Hemoglobin: 13 g/dL (ref 13.0–17.7)
Immature Grans (Abs): 0 10*3/uL (ref 0.0–0.1)
Immature Granulocytes: 0 %
Lymphocytes Absolute: 1.3 10*3/uL (ref 0.7–3.1)
Lymphs: 35 %
MCH: 27.4 pg (ref 26.6–33.0)
MCHC: 32.4 g/dL (ref 31.5–35.7)
MCV: 85 fL (ref 79–97)
Monocytes Absolute: 0.4 10*3/uL (ref 0.1–0.9)
Monocytes: 11 %
Neutrophils Absolute: 1.7 10*3/uL (ref 1.4–7.0)
Neutrophils: 47 %
Platelets: 179 10*3/uL (ref 150–450)
RBC: 4.74 x10E6/uL (ref 4.14–5.80)
RDW: 14.7 % (ref 11.6–15.4)
WBC: 3.7 10*3/uL (ref 3.4–10.8)

## 2022-11-04 LAB — COMPREHENSIVE METABOLIC PANEL
ALT: 44 IU/L (ref 0–44)
AST: 36 IU/L (ref 0–40)
Albumin/Globulin Ratio: 1.8 (ref 1.2–2.2)
Albumin: 4.5 g/dL (ref 4.1–5.1)
Alkaline Phosphatase: 72 IU/L (ref 44–121)
BUN/Creatinine Ratio: 13 (ref 9–20)
BUN: 15 mg/dL (ref 6–24)
Bilirubin Total: 0.6 mg/dL (ref 0.0–1.2)
CO2: 25 mmol/L (ref 20–29)
Calcium: 9.8 mg/dL (ref 8.7–10.2)
Chloride: 100 mmol/L (ref 96–106)
Creatinine, Ser: 1.18 mg/dL (ref 0.76–1.27)
Globulin, Total: 2.5 g/dL (ref 1.5–4.5)
Glucose: 98 mg/dL (ref 70–99)
Potassium: 4.6 mmol/L (ref 3.5–5.2)
Sodium: 138 mmol/L (ref 134–144)
Total Protein: 7 g/dL (ref 6.0–8.5)
eGFR: 75 mL/min/{1.73_m2} (ref >59)

## 2022-11-04 LAB — LIPID PANEL
Chol/HDL Ratio: 3.5 ratio (ref 0.0–5.0)
Cholesterol, Total: 189 mg/dL (ref 100–199)
HDL: 54 mg/dL (ref >39)
LDL Chol Calc (NIH): 115 mg/dL — ABNORMAL HIGH (ref 0–99)
Triglycerides: 109 mg/dL (ref 0–149)
VLDL Cholesterol Cal: 20 mg/dL (ref 5–40)

## 2022-11-04 LAB — TSH: TSH: 0.638 u[IU]/mL (ref 0.450–4.500)

## 2022-11-04 MED ORDER — ATORVASTATIN CALCIUM 80 MG PO TABS: 80.0000 mg | ORAL_TABLET | Freq: Every day | ORAL | 3 refills | Status: DC

## 2022-11-04 NOTE — Addendum Note (Signed)
Addended by: Denita Lung on: 11/04/2022 03:49 AM   Modules accepted: Orders

## 2023-02-25 ENCOUNTER — Encounter: Payer: Self-pay | Admitting: Family Medicine

## 2023-02-25 ENCOUNTER — Ambulatory Visit: Payer: 59 | Admitting: Family Medicine

## 2023-02-25 VITALS — BP 138/84 | HR 64 | Temp 98.0°F | Resp 16 | Wt 176.0 lb

## 2023-02-25 DIAGNOSIS — L089 Local infection of the skin and subcutaneous tissue, unspecified: Secondary | ICD-10-CM

## 2023-02-25 MED ORDER — DOXYCYCLINE HYCLATE 100 MG PO TABS: 100.0000 mg | ORAL_TABLET | Freq: Two times a day (BID) | ORAL | 0 refills | Status: DC

## 2023-02-25 NOTE — Progress Notes (Signed)
   Subjective:    Patient ID: Shawn Cook, male    DOB: Mar 25, 1971, 52 y.o.   MRN: [id number]  HPI He complains of a week or 2 history of reddish lesions that are occurring mainly on his torso and in his axilla that do itch but has not drained anything.  He has had no exposure to anything that he is aware of.   Review of Systems     Objective:   Physical Exam Alert and in no distress.  Multiple erythematous slightly raised lesions are noted on his abdomen and his axilla and 1 on the penis.       Assessment & Plan:  Skin infection - Plan: doxycycline (VIBRA-TABS) 100 MG tablet This appears to be mainly a skin infection and reminds me of MRSA although he is having no vesicles.  If this is not successful I will get dermatology to look at him.

## 2023-03-04 ENCOUNTER — Ambulatory Visit: Payer: 59 | Admitting: Family Medicine

## 2023-06-26 ENCOUNTER — Other Ambulatory Visit: Payer: Self-pay | Admitting: Family Medicine

## 2023-06-26 DIAGNOSIS — J45990 Exercise induced bronchospasm: Secondary | ICD-10-CM

## 2023-10-18 ENCOUNTER — Encounter: Payer: Self-pay | Admitting: Family Medicine

## 2023-10-18 ENCOUNTER — Ambulatory Visit: Payer: 59 | Admitting: Family Medicine

## 2023-10-18 VITALS — BP 136/80 | HR 70 | Ht 68.5 in | Wt 187.4 lb

## 2023-10-18 DIAGNOSIS — Z23 Encounter for immunization: Secondary | ICD-10-CM

## 2023-10-18 DIAGNOSIS — J301 Allergic rhinitis due to pollen: Secondary | ICD-10-CM

## 2023-10-18 DIAGNOSIS — J45990 Exercise induced bronchospasm: Secondary | ICD-10-CM

## 2023-10-18 DIAGNOSIS — E7800 Pure hypercholesterolemia, unspecified: Secondary | ICD-10-CM

## 2023-10-18 DIAGNOSIS — Z8249 Family history of ischemic heart disease and other diseases of the circulatory system: Secondary | ICD-10-CM

## 2023-10-18 DIAGNOSIS — Z0000 Encounter for general adult medical examination without abnormal findings: Secondary | ICD-10-CM

## 2023-10-18 DIAGNOSIS — Z8639 Personal history of other endocrine, nutritional and metabolic disease: Secondary | ICD-10-CM | POA: Diagnosis not present

## 2023-10-18 DIAGNOSIS — Z8619 Personal history of other infectious and parasitic diseases: Secondary | ICD-10-CM

## 2023-10-18 DIAGNOSIS — E890 Postprocedural hypothyroidism: Secondary | ICD-10-CM

## 2023-10-18 DIAGNOSIS — G43109 Migraine with aura, not intractable, without status migrainosus: Secondary | ICD-10-CM

## 2023-10-18 MED ORDER — ATORVASTATIN CALCIUM 80 MG PO TABS: 80.0000 mg | ORAL_TABLET | Freq: Every day | ORAL | 3 refills | Status: DC

## 2023-10-18 MED ORDER — VALACYCLOVIR HCL 1 G PO TABS: 1000.0000 mg | ORAL_TABLET | Freq: Every day | ORAL | 3 refills | Status: DC

## 2023-10-18 MED ORDER — LEVOTHYROXINE SODIUM 175 MCG PO TABS: 175.0000 ug | ORAL_TABLET | Freq: Every day | ORAL | 3 refills | Status: DC

## 2023-10-18 MED ORDER — ALBUTEROL SULFATE HFA 108 (90 BASE) MCG/ACT IN AERS: INHALATION_SPRAY | RESPIRATORY_TRACT | 0 refills | Status: DC

## 2023-10-18 NOTE — Progress Notes (Signed)
Complete physical exam  Patient: Shawn Cook   DOB: May 08, 1971   52 y.o. Male  MRN: [id number]  Subjective:     Shawn Cook is a 53 y.o. male who presents today for a complete physical exam. He reports consuming a general diet. Gym/ health club routine includes cardio and mod to heavy weightlifting.  He notes that he has left that slide and has noted some weight gain.  He does plan to get back involved in this.  He generally feels fairly well. He reports sleeping fairly well. He he notes that he has had to use his inhaler little bit more than normal but did get a new dog and wonders if this is related to that.  Continues on albuterol as needed.  He is also taking Lipitor and Synthroid.  Occasionally will use Valtrex.  Otherwise his marriage and home life are going well as is his work.  He has been working with the fire department for 25 years now.  Most recent fall risk assessment:    10/18/2023    2:48 PM  Fall Risk   Falls in the past year? 0  Number falls in past yr: 0  Injury with Fall? 0     Most recent depression screenings:    10/18/2023    2:48 PM 11/03/2022    8:21 AM  PHQ 2/9 Scores  PHQ - 2 Score 0 0    Vision:Within last year and Dental: Current dental problems and Last dental visit: October 2024    Patient Care Team: Ronnald Nian, MD as PCP - General (Family Medicine)   Outpatient Medications Prior to Visit  Medication Sig   Multiple Vitamin (MULTIVITAMIN WITH MINERALS) TABS tablet Take 1 tablet by mouth daily.   [DISCONTINUED] albuterol (VENTOLIN HFA) 108 (90 Base) MCG/ACT inhaler USE 2 PUFFS EVERY 6 HOURS AS NEEDED FOR WHEEZING   doxycycline (VIBRA-TABS) 100 MG tablet Take 1 tablet (100 mg total) by mouth 2 (two) times daily. (Patient not taking: Reported on 10/18/2023)   [DISCONTINUED] atorvastatin (LIPITOR) 80 MG tablet Take 1 tablet (80 mg total) by mouth daily. (Patient taking differently: Take 40 mg by mouth daily.)   [DISCONTINUED]  levothyroxine (SYNTHROID) 175 MCG tablet Take 1 tablet (175 mcg total) by mouth daily.   [DISCONTINUED] valACYclovir (VALTREX) 1000 MG tablet Take 1 tablet (1,000 mg total) by mouth daily. (Patient not taking: Reported on 10/18/2023)   No facility-administered medications prior to visit.    Review of Systems  All other systems reviewed and are negative. Family and social history as well as health maintenance and immunizations was reviewed        Objective:      Physical Exam  Alert and in no distress. Tympanic membranes and canals are normal. Pharyngeal area is normal. Neck is supple without adenopathy or thyromegaly. Cardiac exam shows a regular sinus rhythm without murmurs or gallops. Lungs are clear to auscultation.  Abdominal exam shows no masses or tenderness.       Assessment & Plan:    Routine general medical examination at a health care facility - Plan: CBC with Differential/Platelet, Comprehensive metabolic panel, Lipid panel  Seasonal allergic rhinitis due to pollen  Exercise-induced asthma - Plan: albuterol (VENTOLIN HFA) 108 (90 Base) MCG/ACT inhaler  Family history of heart disease in male family member before age 66  History of Graves' disease  Hypercholesterolemia - Plan: Lipid panel, atorvastatin (LIPITOR) 80 MG tablet  Hypothyroidism, postablative - Plan: TSH, levothyroxine (SYNTHROID)  175 MCG tablet  Ocular migraine  History of herpes labialis - Plan: valACYclovir (VALTREX) 1000 MG tablet  Need for shingles vaccine - Plan: Zoster Recombinant (Shingrix )  Immunization History  Administered Date(s) Administered   Influenza Split 08/27/2009   Influenza,inj,Quad PF,6+ Mos 12/12/2014, 07/25/2018, 08/16/2022   Influenza-Unspecified 08/27/2016, 09/13/2017, 08/30/2020, 08/20/2021   Moderna Sars-Covid-2 Vaccination 12/18/2019, 01/15/2020, 10/07/2020   Pfizer(Comirnaty)Fall Seasonal Vaccine 12 years and older 11/03/2022   Tdap 01/13/2012, 11/03/2022    Zoster Recombinant(Shingrix) 10/18/2023    Health Maintenance  Topic Date Due   HIV Screening  Never done   Fecal DNA (Cologuard)  10/17/2023   COVID-19 Vaccine (5 - 2023-24 season) 11/03/2023 (Originally 07/18/2023)   INFLUENZA VACCINE  02/14/2024 (Originally 06/17/2023)   Zoster Vaccines- Shingrix (2 of 2) 12/13/2023   DTaP/Tdap/Td (3 - Td or Tdap) 11/03/2032   Hepatitis C Screening  Completed   HPV VACCINES  Aged Out    Discussed health benefits of physical activity, and encouraged him to engage in regular exercise appropriate for his age and condition and not let things get in the way of taking better care of himself as he has gained some weight.  Problem List Items Addressed This Visit     Allergic rhinitis due to pollen   Exercise-induced asthma   Relevant Medications   albuterol (VENTOLIN HFA) 108 (90 Base) MCG/ACT inhaler   Family history of heart disease in male family member before age 64   History of Graves' disease   History of herpes labialis   Relevant Medications   valACYclovir (VALTREX) 1000 MG tablet   Hypercholesterolemia   Relevant Medications   atorvastatin (LIPITOR) 80 MG tablet   Other Relevant Orders   Lipid panel   Hypothyroidism, postablative   Relevant Medications   levothyroxine (SYNTHROID) 175 MCG tablet   Other Relevant Orders   TSH   Ocular migraine   Relevant Medications   atorvastatin (LIPITOR) 80 MG tablet   Other Visit Diagnoses     Routine general medical examination at a health care facility    -  Primary   Relevant Orders   CBC with Differential/Platelet   Comprehensive metabolic panel   Lipid panel   Need for shingles vaccine       Relevant Orders   Zoster Recombinant (Shingrix ) (Completed)      Follow-up 1 year    Sharlot Gowda, MD

## 2023-10-19 LAB — TSH: TSH: 0.874 u[IU]/mL (ref 0.450–4.500)

## 2023-10-19 LAB — CBC WITH DIFFERENTIAL/PLATELET
Basophils Absolute: 0 10*3/uL (ref 0.0–0.2)
Basos: 1 %
EOS (ABSOLUTE): 0.3 10*3/uL (ref 0.0–0.4)
Eos: 6 %
Hematocrit: 38.8 % (ref 37.5–51.0)
Hemoglobin: 12 g/dL — ABNORMAL LOW (ref 13.0–17.7)
Immature Grans (Abs): 0 10*3/uL (ref 0.0–0.1)
Immature Granulocytes: 0 %
Lymphocytes Absolute: 1.3 10*3/uL (ref 0.7–3.1)
Lymphs: 31 %
MCH: 25.5 pg — ABNORMAL LOW (ref 26.6–33.0)
MCHC: 30.9 g/dL — ABNORMAL LOW (ref 31.5–35.7)
MCV: 82 fL (ref 79–97)
Monocytes Absolute: 0.4 10*3/uL (ref 0.1–0.9)
Monocytes: 10 %
Neutrophils Absolute: 2.2 10*3/uL (ref 1.4–7.0)
Neutrophils: 52 %
Platelets: 199 10*3/uL (ref 150–450)
RBC: 4.71 x10E6/uL (ref 4.14–5.80)
RDW: 15.3 % (ref 11.6–15.4)
WBC: 4.2 10*3/uL (ref 3.4–10.8)

## 2023-10-19 LAB — COMPREHENSIVE METABOLIC PANEL
ALT: 38 IU/L (ref 0–44)
AST: 27 IU/L (ref 0–40)
Albumin: 4.3 g/dL (ref 3.8–4.9)
Alkaline Phosphatase: 74 IU/L (ref 44–121)
BUN/Creatinine Ratio: 23 — ABNORMAL HIGH (ref 9–20)
BUN: 23 mg/dL (ref 6–24)
Bilirubin Total: 0.3 mg/dL (ref 0.0–1.2)
CO2: 25 mmol/L (ref 20–29)
Calcium: 9.5 mg/dL (ref 8.7–10.2)
Chloride: 103 mmol/L (ref 96–106)
Creatinine, Ser: 1.02 mg/dL (ref 0.76–1.27)
Globulin, Total: 2.1 g/dL (ref 1.5–4.5)
Glucose: 97 mg/dL (ref 70–99)
Potassium: 4.8 mmol/L (ref 3.5–5.2)
Sodium: 140 mmol/L (ref 134–144)
Total Protein: 6.4 g/dL (ref 6.0–8.5)
eGFR: 89 mL/min/{1.73_m2} (ref >59)

## 2023-10-19 LAB — LIPID PANEL
Cholesterol, Total: 175 mg/dL (ref 100–199)
HDL: 51 mg/dL (ref >39)
LDL CALC COMMENT:: 3.4 ratio (ref 0.0–5.0)
LDL Chol Calc (NIH): 111 mg/dL — ABNORMAL HIGH (ref 0–99)
Triglycerides: 68 mg/dL (ref 0–149)
VLDL Cholesterol Cal: 13 mg/dL (ref 5–40)

## 2023-10-19 MED ORDER — ROSUVASTATIN CALCIUM 40 MG PO TABS: 40.0000 mg | ORAL_TABLET | Freq: Every day | ORAL | 3 refills | Status: DC

## 2023-10-19 NOTE — Addendum Note (Signed)
Addended by: Ronnald Nian on: 10/19/2023 02:23 PM   Modules accepted: Orders

## 2023-10-20 ENCOUNTER — Encounter: Payer: Self-pay | Admitting: Family Medicine

## 2023-11-10 ENCOUNTER — Other Ambulatory Visit: Payer: Self-pay | Admitting: Family Medicine

## 2023-11-10 DIAGNOSIS — J45990 Exercise induced bronchospasm: Secondary | ICD-10-CM

## 2023-11-18 ENCOUNTER — Encounter: Payer: 59 | Admitting: Family Medicine

## 2023-11-28 ENCOUNTER — Other Ambulatory Visit: Payer: Self-pay | Admitting: Family Medicine

## 2023-11-28 DIAGNOSIS — E7800 Pure hypercholesterolemia, unspecified: Secondary | ICD-10-CM

## 2023-11-28 DIAGNOSIS — Z8249 Family history of ischemic heart disease and other diseases of the circulatory system: Secondary | ICD-10-CM

## 2023-12-08 ENCOUNTER — Other Ambulatory Visit: Payer: Self-pay | Admitting: Family Medicine

## 2023-12-08 DIAGNOSIS — J45990 Exercise induced bronchospasm: Secondary | ICD-10-CM

## 2023-12-17 ENCOUNTER — Encounter: Payer: Self-pay | Admitting: Family Medicine

## 2023-12-29 ENCOUNTER — Other Ambulatory Visit (INDEPENDENT_AMBULATORY_CARE_PROVIDER_SITE_OTHER): Payer: 59

## 2023-12-29 DIAGNOSIS — Z23 Encounter for immunization: Secondary | ICD-10-CM

## 2024-01-05 ENCOUNTER — Other Ambulatory Visit: Payer: Self-pay | Admitting: Family Medicine

## 2024-01-05 DIAGNOSIS — J45990 Exercise induced bronchospasm: Secondary | ICD-10-CM

## 2024-01-11 ENCOUNTER — Encounter: Payer: Self-pay | Admitting: Internal Medicine

## 2024-01-29 ENCOUNTER — Other Ambulatory Visit: Payer: Self-pay | Admitting: Family Medicine

## 2024-01-29 DIAGNOSIS — J45990 Exercise induced bronchospasm: Secondary | ICD-10-CM

## 2024-10-08 ENCOUNTER — Other Ambulatory Visit: Payer: Self-pay | Admitting: Family Medicine

## 2024-10-15 ENCOUNTER — Other Ambulatory Visit: Payer: Self-pay | Admitting: Family Medicine

## 2024-10-15 DIAGNOSIS — E890 Postprocedural hypothyroidism: Secondary | ICD-10-CM

## 2024-10-31 ENCOUNTER — Encounter: Payer: Self-pay | Admitting: Family Medicine

## 2024-10-31 ENCOUNTER — Ambulatory Visit (INDEPENDENT_AMBULATORY_CARE_PROVIDER_SITE_OTHER): Payer: 59 | Admitting: Family Medicine

## 2024-10-31 VITALS — BP 130/78 | HR 72 | Ht 68.0 in | Wt 181.4 lb

## 2024-10-31 DIAGNOSIS — Z1211 Encounter for screening for malignant neoplasm of colon: Secondary | ICD-10-CM | POA: Diagnosis not present

## 2024-10-31 DIAGNOSIS — E890 Postprocedural hypothyroidism: Secondary | ICD-10-CM

## 2024-10-31 DIAGNOSIS — E7800 Pure hypercholesterolemia, unspecified: Secondary | ICD-10-CM | POA: Diagnosis not present

## 2024-10-31 DIAGNOSIS — Z0000 Encounter for general adult medical examination without abnormal findings: Secondary | ICD-10-CM

## 2024-10-31 MED ORDER — ASPIRIN 81 MG PO TBEC: 81.0000 mg | DELAYED_RELEASE_TABLET | Freq: Every day | ORAL | Status: AC

## 2024-10-31 NOTE — Progress Notes (Signed)
° °  Name: Shawn Cook   Date of Visit: 10/31/2024   Date of last visit with me: Visit date not found   CHIEF COMPLAINT:  Chief Complaint  Patient presents with   Annual Exam    Cpe/fasting.         HPI:  Discussed the use of AI scribe software for clinical note transcription with the patient, who gave verbal consent to proceed.  History of Present Illness Shawn Cook is a 53 year old male who presents for an annual physical exam.  He has no specific concerns or issues at this time. He acknowledges not exercising as much as he should and plans to prioritize it in the new year.  He has been on thyroid medication for almost 20 years and is concerned about the need for adjustments if his TSH levels change. He reports no symptoms of fatigue or other thyroid-related issues at this time. He is unsure about the status of his medication refills, which ran out in early December, but believes he usually receives a 90-day supply with refills.  No urinary issues such as stopping and going.     OBJECTIVE:       10/31/2024    8:12 AM  Depression screen PHQ 2/9  Decreased Interest 0  Down, Depressed, Hopeless 0  PHQ - 2 Score 0     BP Readings from Last 3 Encounters:  10/31/24 130/78  10/18/23 136/80  02/25/23 138/84    BP 130/78   Pulse 72   Ht 5' 8 (1.727 m)   Wt 181 lb 6.4 oz (82.3 kg)   SpO2 98%   BMI 27.58 kg/m    Physical Exam    Physical Exam Constitutional:      Appearance: Normal appearance.  Neurological:     General: No focal deficit present.     Mental Status: He is alert and oriented to person, place, and time. Mental status is at baseline.     ASSESSMENT/PLAN:   Assessment & Plan Hypothyroidism, postablative  Hypercholesterolemia  Annual physical exam  Encounter for screening colonoscopy    Assessment and Plan Assessment & Plan Postablative hypothyroidism Well-managed with no symptoms. TSH levels may fluctuate without  clinical significance if asymptomatic. - Continue current thyroid medication regimen. - Monitor TSH levels periodically, especially if symptoms develop. - CBC   Hypercholesterolemia Cholesterol levels are well-controlled. - Encouraged regular exercise for joint health and overall well-being. - Lipid panels and CMP pending - Continue crestor    General Health Maintenance PSA screening deferred until age 42. HIV screening discussed and agreed upon. - Ordered basic labs including thyroid, cholesterol, electrolytes, and hemoglobin. - Ordered Cologuard test for colorectal cancer screening. -Comprehensive annual physical exam completed today. Reviewed interval history, current medical issues, medications, allergies, and preventive care needs. Addressed all patient questions and concerns. Discussed lifestyle factors including diet, exercise, sleep, and stress management. Reviewed recommended age-appropriate screenings, labs, and vaccinations. Counseling provided on healthy habits and routine health maintenance. Follow-up as indicated based on findings and results.      Jaiyon Wander A. Vita MD [facility]

## 2024-11-01 ENCOUNTER — Ambulatory Visit: Payer: Self-pay | Admitting: Family Medicine

## 2024-11-01 LAB — LIPID PANEL
Chol/HDL Ratio: 2.6 ratio (ref 0.0–5.0)
Cholesterol, Total: 163 mg/dL (ref 100–199)
HDL: 62 mg/dL (ref >39)
LDL Chol Calc (NIH): 87 mg/dL (ref 0–99)
Triglycerides: 73 mg/dL (ref 0–149)
VLDL Cholesterol Cal: 14 mg/dL (ref 5–40)

## 2024-11-01 LAB — CBC WITH DIFFERENTIAL/PLATELET
Basophils Absolute: 0 x10E3/uL (ref 0.0–0.2)
Basos: 1 %
EOS (ABSOLUTE): 0.3 x10E3/uL (ref 0.0–0.4)
Eos: 7 %
Hematocrit: 42 % (ref 37.5–51.0)
Hemoglobin: 13.3 g/dL (ref 13.0–17.7)
Immature Grans (Abs): 0 x10E3/uL (ref 0.0–0.1)
Immature Granulocytes: 0 %
Lymphocytes Absolute: 1.3 x10E3/uL (ref 0.7–3.1)
Lymphs: 29 %
MCH: 27.3 pg (ref 26.6–33.0)
MCHC: 31.7 g/dL (ref 31.5–35.7)
MCV: 86 fL (ref 79–97)
Monocytes Absolute: 0.4 x10E3/uL (ref 0.1–0.9)
Monocytes: 8 %
Neutrophils Absolute: 2.5 x10E3/uL (ref 1.4–7.0)
Neutrophils: 55 %
Platelets: 191 x10E3/uL (ref 150–450)
RBC: 4.87 x10E6/uL (ref 4.14–5.80)
RDW: 14.8 % (ref 11.6–15.4)
WBC: 4.4 x10E3/uL (ref 3.4–10.8)

## 2024-11-01 LAB — HIV ANTIBODY (ROUTINE TESTING W REFLEX): HIV Screen 4th Generation wRfx: NONREACTIVE

## 2024-11-01 LAB — TSH: TSH: 1.15 u[IU]/mL (ref 0.450–4.500)

## 2024-11-23 LAB — COLOGUARD: COLOGUARD: NEGATIVE

## 2024-12-14 ENCOUNTER — Other Ambulatory Visit (HOSPITAL_BASED_OUTPATIENT_CLINIC_OR_DEPARTMENT_OTHER): Payer: Self-pay | Admitting: Family Medicine

## 2024-12-14 DIAGNOSIS — Z8249 Family history of ischemic heart disease and other diseases of the circulatory system: Secondary | ICD-10-CM

## 2024-12-18 ENCOUNTER — Other Ambulatory Visit: Payer: Self-pay | Admitting: Family Medicine

## 2024-12-18 DIAGNOSIS — Z8619 Personal history of other infectious and parasitic diseases: Secondary | ICD-10-CM

## 2025-01-02 ENCOUNTER — Other Ambulatory Visit (HOSPITAL_BASED_OUTPATIENT_CLINIC_OR_DEPARTMENT_OTHER)

## 2025-11-01 ENCOUNTER — Encounter: Admitting: Family Medicine
